# Patient Record
Sex: Male | Born: 1950 | Race: White | Marital: Married | Smoking: Current some day smoker
Health system: Northeastern US, Academic
[De-identification: ages and names within clinical notes are randomized; demographics above are authoritative.]

## PROBLEM LIST (undated history)

## (undated) MED FILL — Immune Globulin (Human) IV Soln 5 GM/50ML: INTRAVENOUS | Qty: 950 | Status: AC

---

## 2018-10-07 DIAGNOSIS — I4891 Unspecified atrial fibrillation: Secondary | ICD-10-CM | POA: Insufficient documentation

## 2021-08-01 ENCOUNTER — Encounter: Payer: Self-pay | Admitting: Gastroenterology

## 2021-08-04 IMAGING — MR MRI CERVICAL SPINE WITHOUT CONTRAST
6 series · 29 of 48 positions shown · IV contrast (gadolinium)
Comparison: None

________________________________________________________________________________________________ 
MRI CERVICAL SPINE WITHOUT CONTRAST, 08/04/2021 [DATE]: 
CLINICAL INDICATION: Quadriparesis, extremity weakness x2 years, gait 
difficulty, balance difficulty
TECHNIQUE: Sagittal T1, Sagittal T2, Sagittal STIR, Axial TSE and Axial [id number] 
images of the cervical spine were performed without intravenous gadolinium 
enhancement.

[Series 101: survey · axial · 10.0mm · 0.94mm/px · z∈[-15,+150]mm · 4 of 9 slices shown]
[im 1/9]
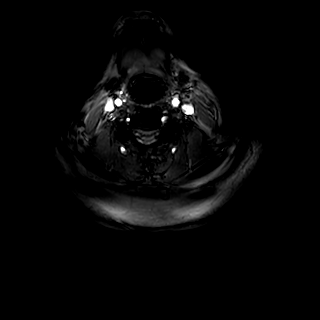
[im 3/9]
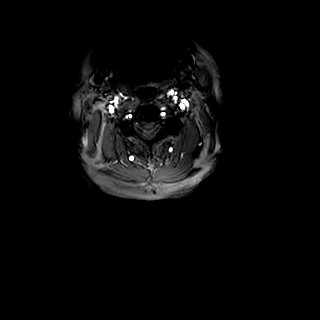
[im 6/9]
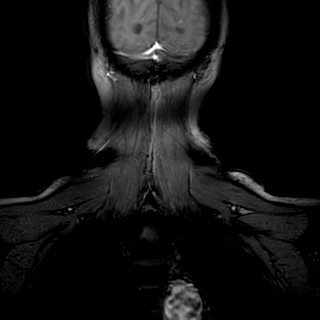
[im 9/9]
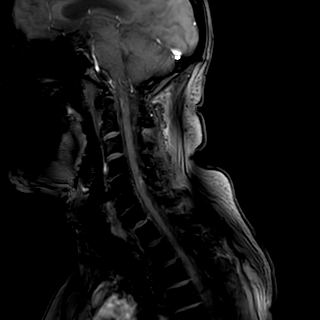

[Series 201: t2w_tse cor · coronal · 5.0mm · 0.52mm/px · 3 of 7 slices shown]
[im 1/7]
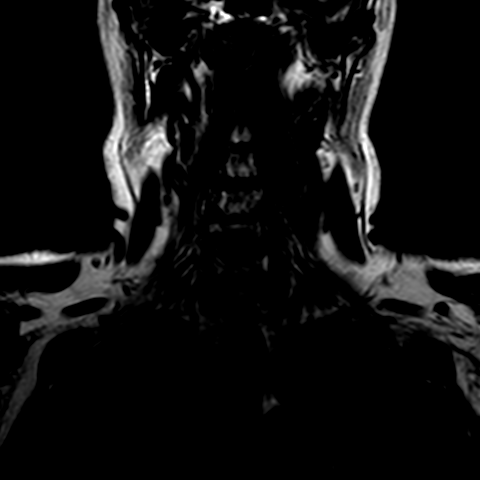
[im 4/7]
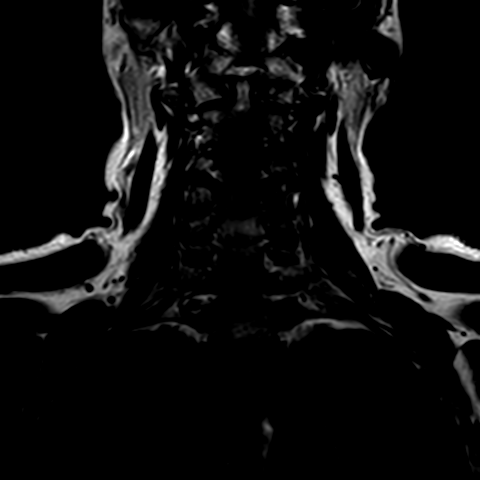
[im 7/7]
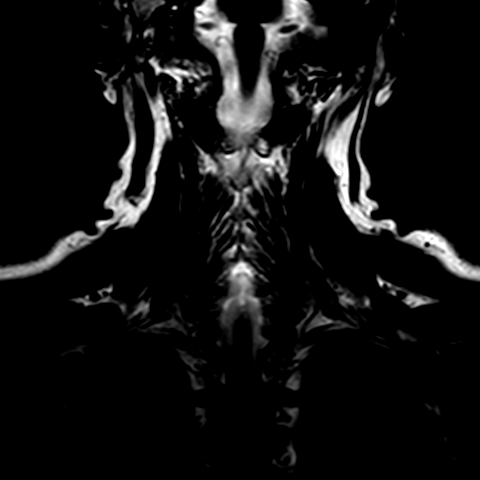

[Series 301: t1w_tse sag · sagittal · 3.0mm · 0.40mm/px · 7 of 15 slices shown]
[im 1/15]
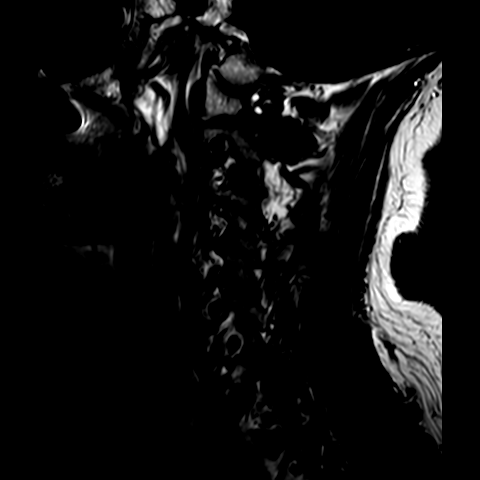
[im 3/15]
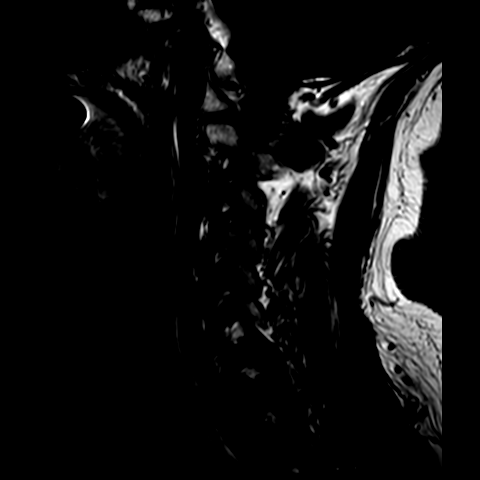
[im 5/15]
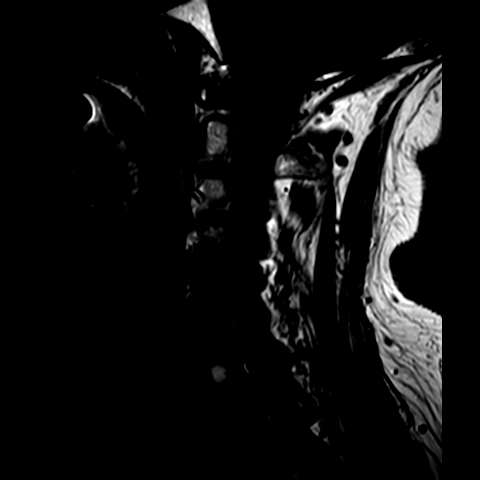
[im 8/15]
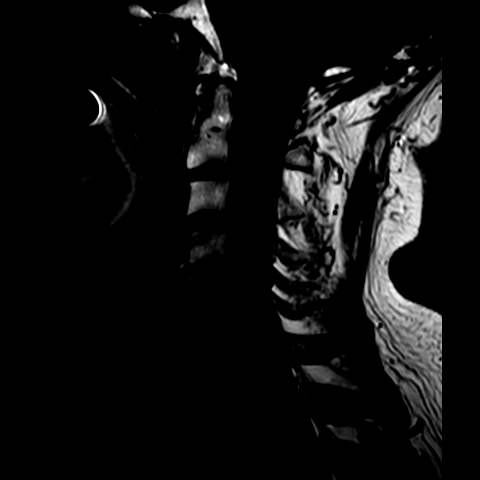
[im 10/15]
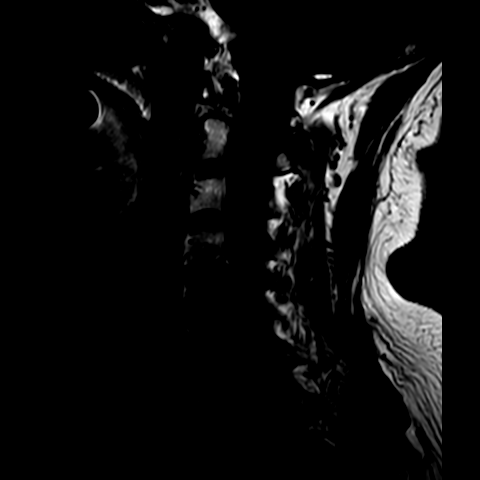
[im 12/15]
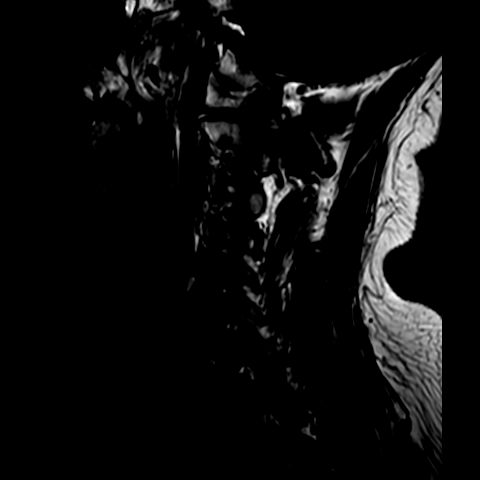
[im 15/15]
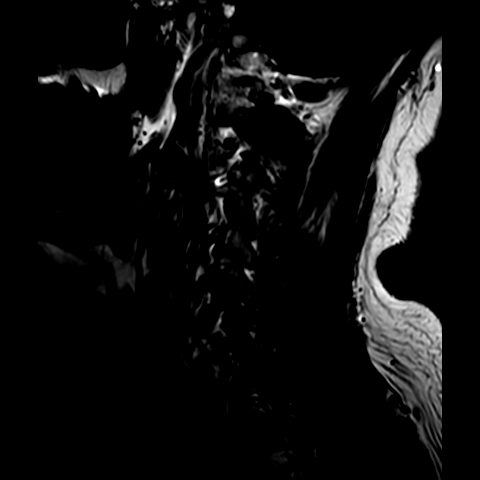

[Series 401: t2w_tse sag · sagittal · 3.0mm · 0.47mm/px · 7 of 15 slices shown]
[im 1/15]
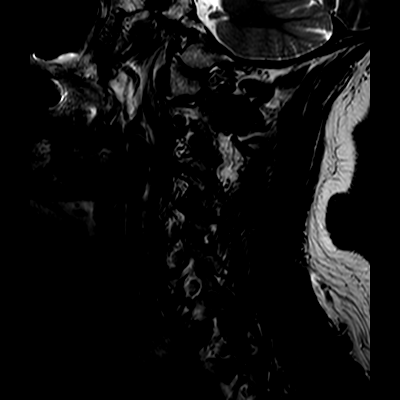
[im 3/15]
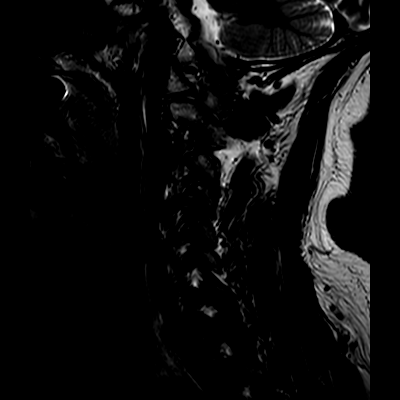
[im 5/15]
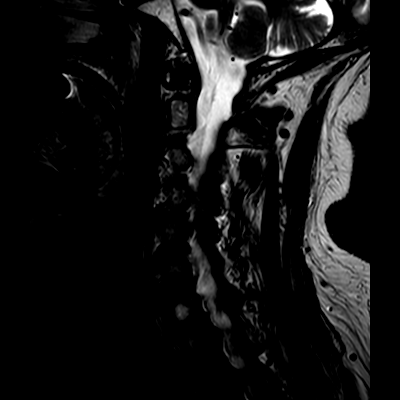
[im 8/15]
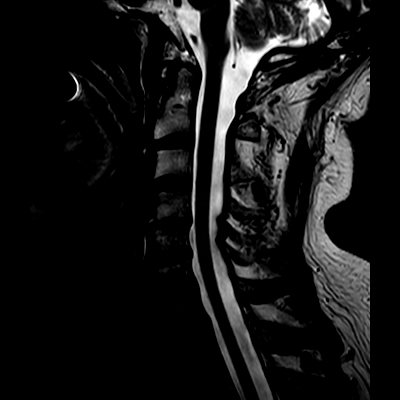
[im 10/15]
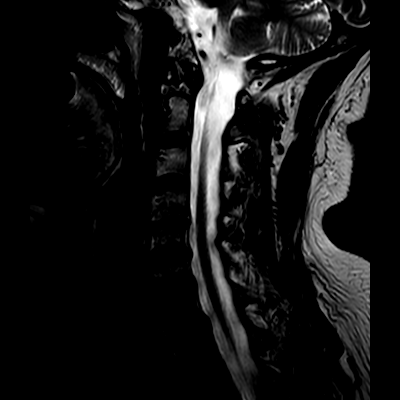
[im 12/15]
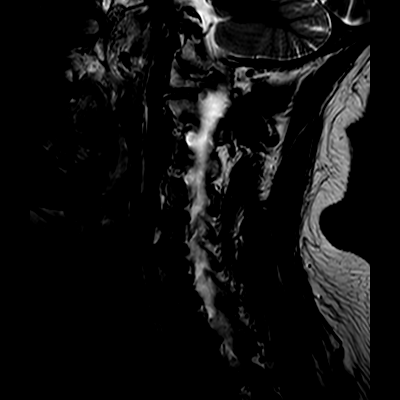
[im 15/15]
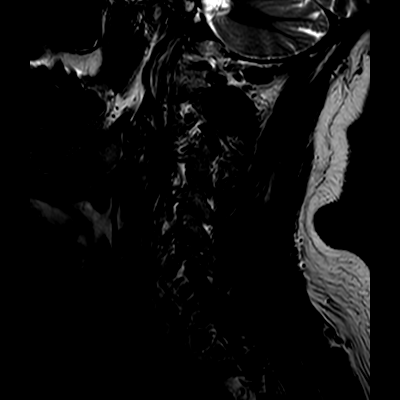

[Series 501: stir_longte sag · sagittal · 3.0mm · 0.56mm/px · 7 of 15 slices shown]
[im 1/15]
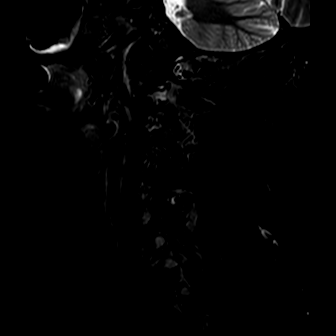
[im 3/15]
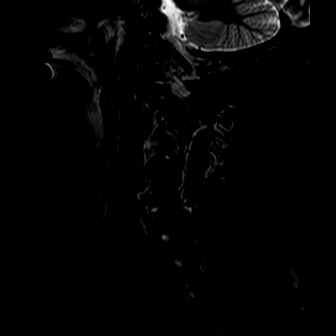
[im 5/15]
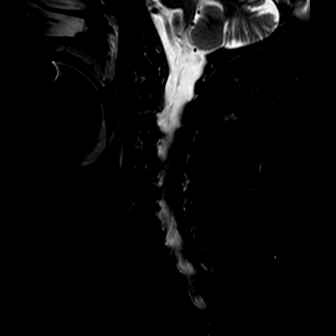
[im 8/15]
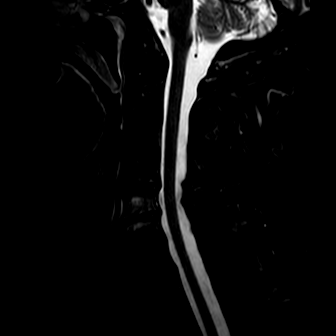
[im 10/15]
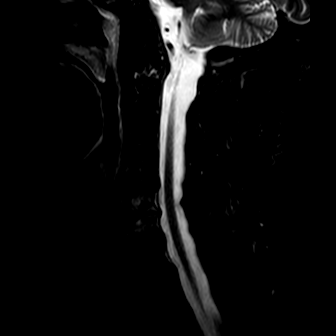
[im 12/15]
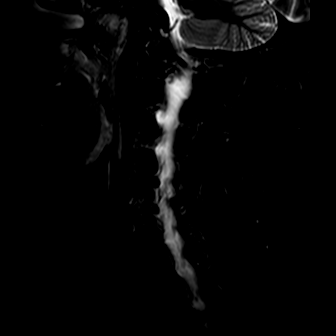
[im 15/15]
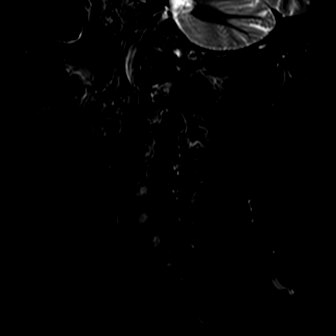

[Series 601: t2w_tse_ax · axial · 3.0mm · 0.36mm/px · 1 of 44 slices shown]
[im 3/44]
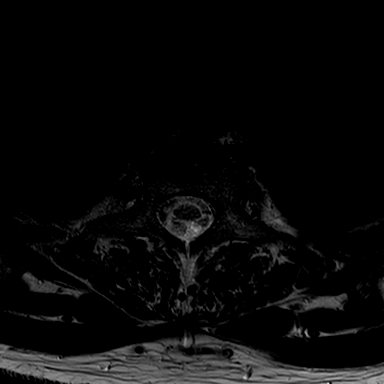

[29 of 48 positions shown; findings below may reference images not displayed]

FINDINGS: Cervical vertebral heights are intact. There is spondylosis, with 
marked disc narrowing from C4 through C7. 
The dens is intact. The craniocervical junction is open. Cord signal appears 
normal. There is no evidence for spinal malignancy. 
There is mild disc-osteophyte at C4-5, C5-6 and C6-7. There is no cord deformity 
or significant cervical canal stenosis. There is 2 mm anterolisthesis at C3-4 
and C7-T1. 
Axial images show mild to moderate bilateral foraminal stenosis at C4-5, also 
bilateral involvement at C5-6. Other cervical foramina appear open. 
There is moderately extensive reactive endplate edema/Modic type I change at 
C5-6, with minimal involvement at C4-5. 
There are zygapophyseal facet degenerative changes throughout the cervical 
spine. There is mild reactive edema involving the left C2-3 zygapophyseal facet.
IMPRESSION: Spondylosis. There is no significant canal stenosis. Mild to moderate bilateral 
foraminal stenosis at C4-5 and C5-6. 
Moderately extensive Modic type I change at C5-6. Mild reactive edema involves 
the left C2-3 zygapophyseal facet pillar. 
Slight degrees of degenerative anterolisthesis at C3-4 and C7-T1. 
Cord signal appears normal. 
No evidence for fracture or spinal malignancy.

## 2021-08-08 IMAGING — MR MRI LUMBAR SPINE WITHOUT CONTRAST
4 of 6 series · 14 of 48 positions shown · IV contrast (gadolinium)
Comparison: None

________________________________________________________________________________________________ 
MRI LUMBAR SPINE WITHOUT CONTRAST, 08/08/2021 [DATE]: 
CLINICAL INDICATION: Quadriparesis. Patient declined to provide history for 
lumbar spine.
TECHNIQUE: Sagittal T1, Sagittal T2, Sagittal STIR, Axial T1 and Axial T2 MR 
images of the lumbar spine were performed without intravenous gadolinium 
enhancement.

[Series 101: survey · axial · 10.0mm · 1.39mm/px · z∈[-15,+199]mm · 3 of 9 slices shown]
[im 1/9]
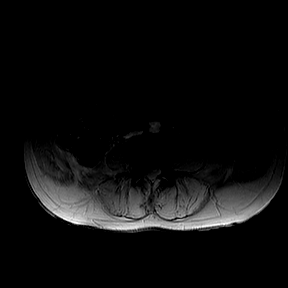
[im 5/9]
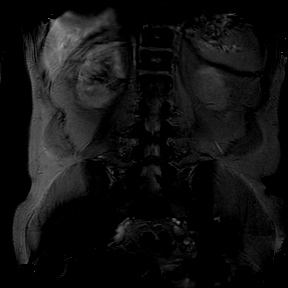
[im 9/9]
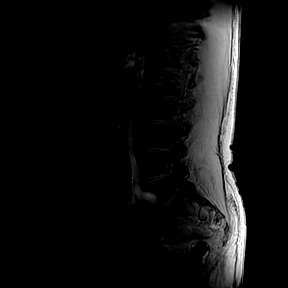

[Series 201: t2w_cor-surv · coronal · 6.0mm · 0.50mm/px · 3 of 6 slices shown]
[im 1/6]
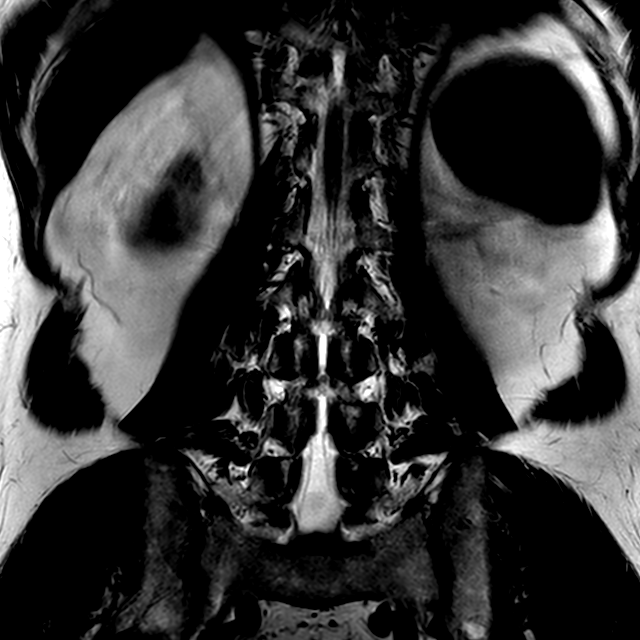
[im 3/6]
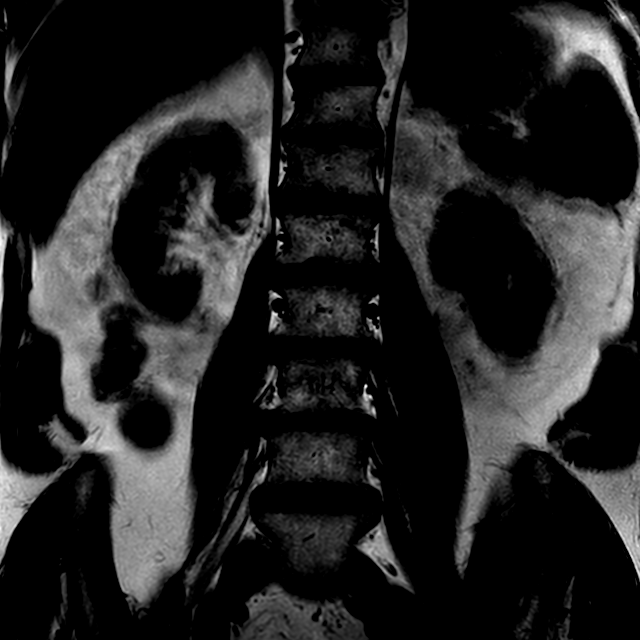
[im 6/6]
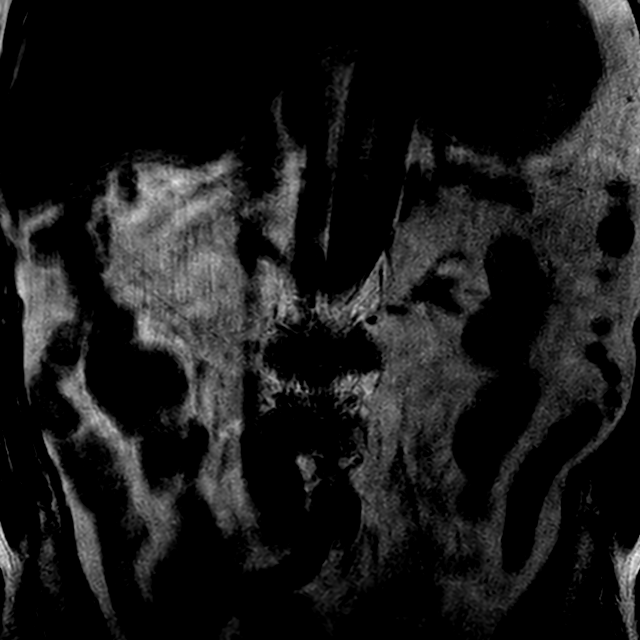

[Series 301: t2w_tse sag · sagittal · 4.0mm · 0.35mm/px · 5 of 18 slices shown]
[im 1/18]
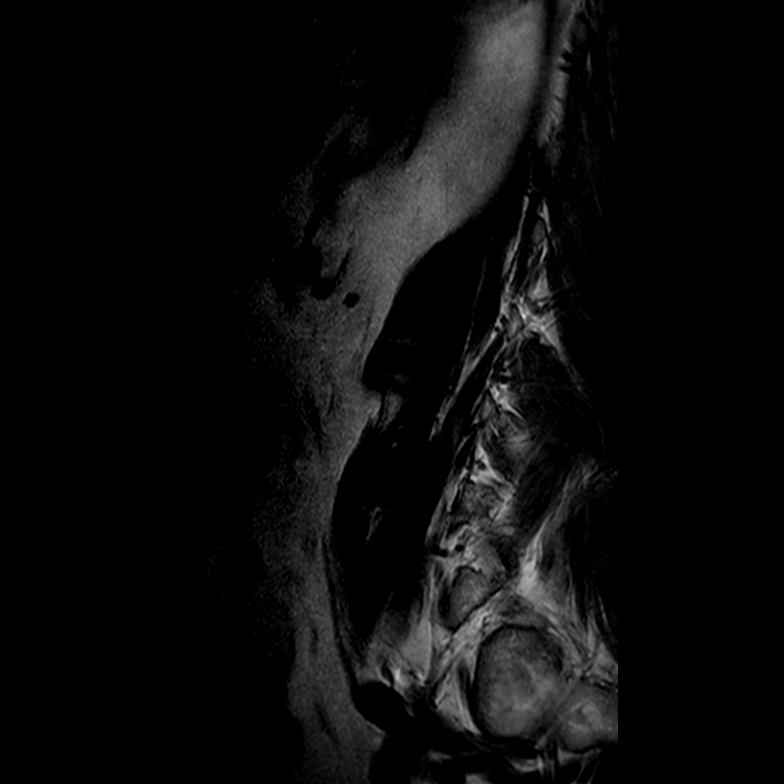
[im 3/18]
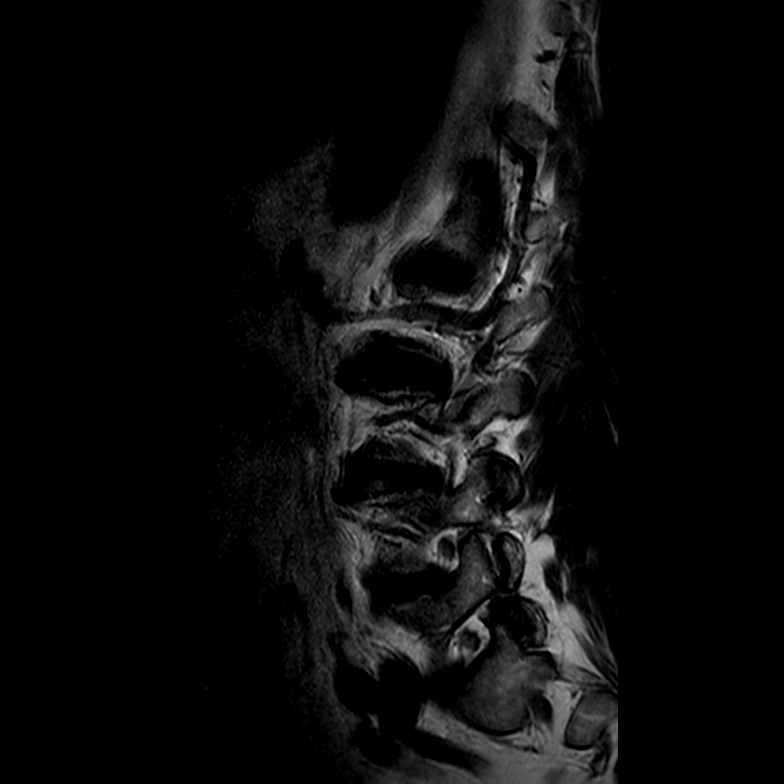
[im 5/18]
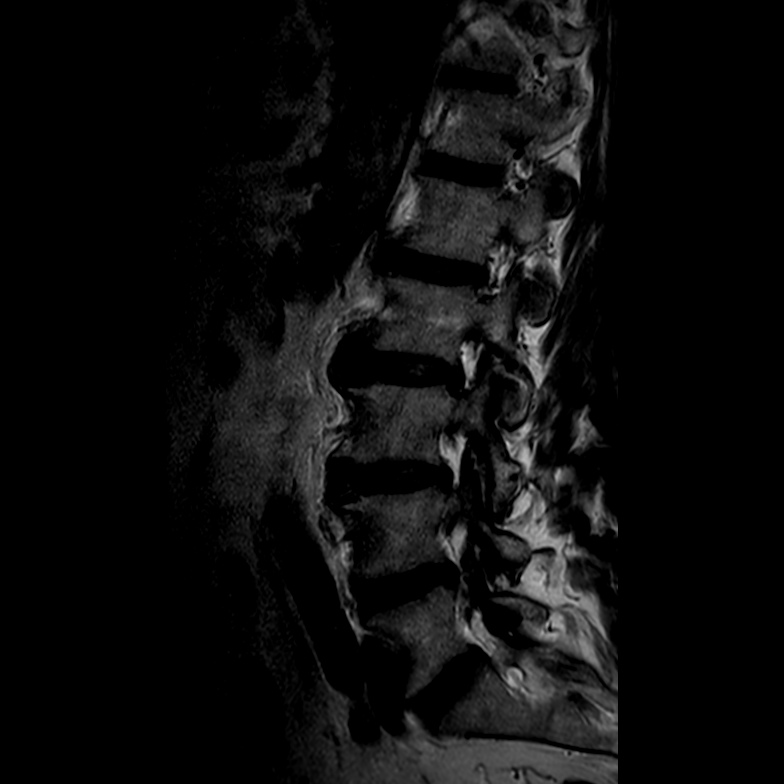
[im 9/18]
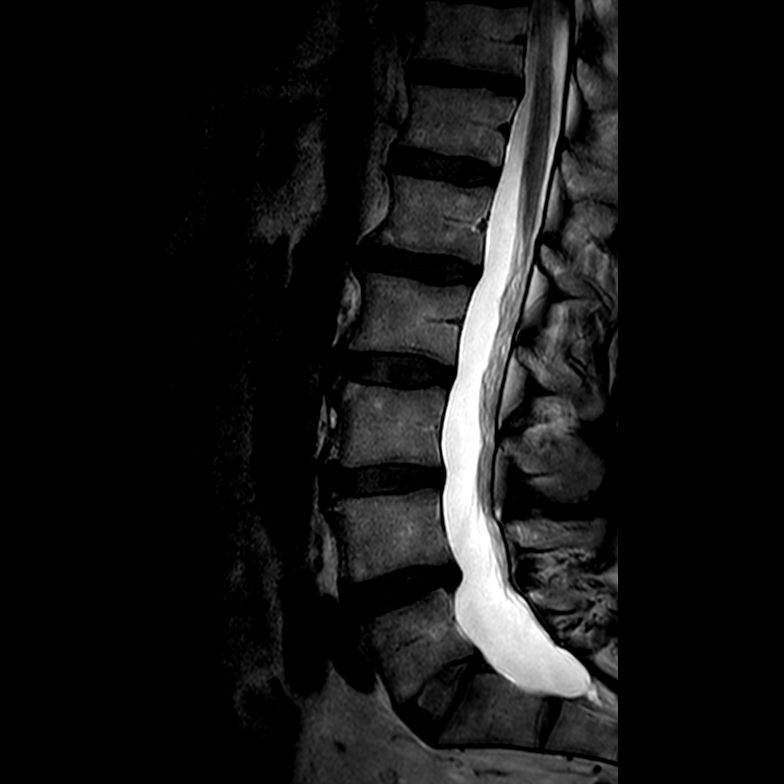
[im 15/18]
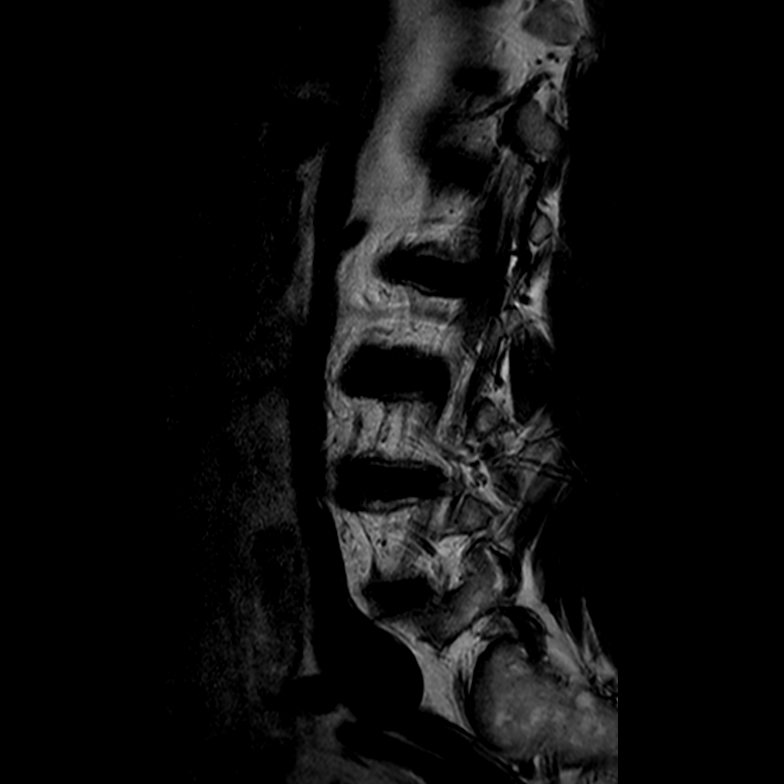

[Series 401: t1w_tse sag · sagittal · 4.0mm · 0.54mm/px · 3 of 18 slices shown]
[im 3/18]
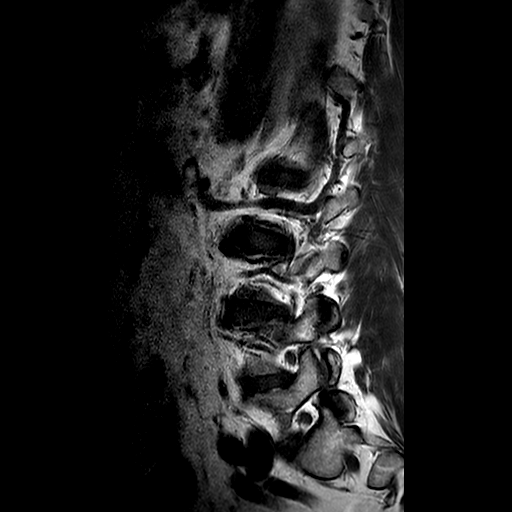
[im 9/18]
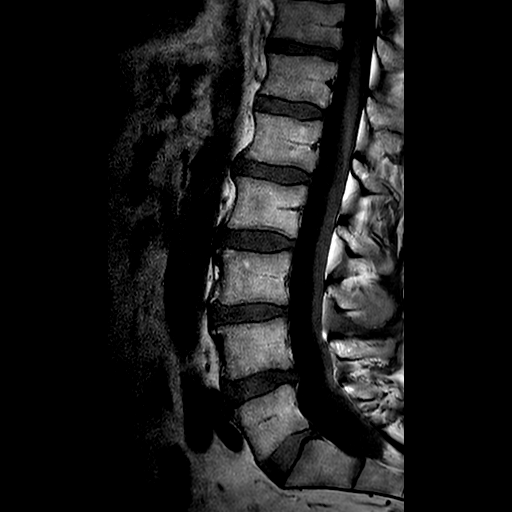
[im 15/18]
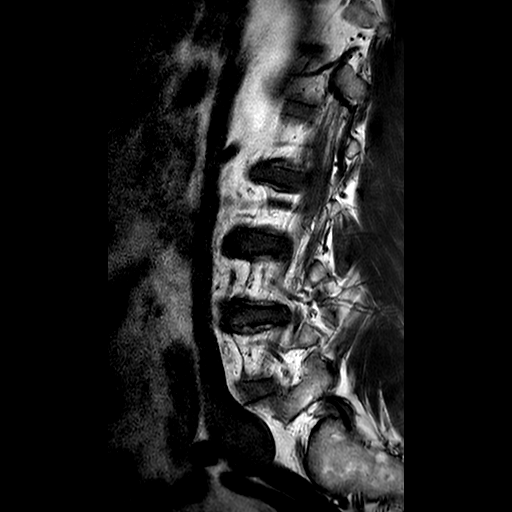

[14 of 48 positions shown; findings below may reference images not displayed]

FINDINGS: Lumbar vertebral heights are intact. 2 mm retrolisthesis at L4-5. 
There is moderate narrowing on the posterior L5-S1 disc interspace. Other lumbar 
disc heights are intact. 
There is midline disc bulge at L4-5, without significant canal stenosis. Mild 
narrowing of the upper L5 lateral recesses. There is mild disc bulge at L3-4 and 
borderline canal stenosis. Canal is open elsewhere. There is no significant 
foraminal impingement. Conus terminates opposite T12-L1. No Modic type I 
changes. No evidence for compression fracture or malignancy. 
There is asymmetric decreased size of the right psoas muscle compared to the 
left. On the lowest axial images there is asymmetry of the upper visualized 
iliopsoas musculature, likely with atrophy of the right. CT of the pelvis would 
be useful for further evaluation.
IMPRESSION: Mild disc bulge at L4-5 with slight retrolisthesis produces mild encroachment on 
the upper L5 lateral recesses. This could worsen with weightbearing; clinical 
correlation. 
Conus appears normal in signal, position and morphology. 
Note made of asymmetry of the right psoas muscle and visualized right iliac 
venous, decreased in size compared to the left. There is no lumbar scoliosis. 
Clinical correlation advised. CT of the pelvis would be useful for further 
evaluation.

## 2021-08-10 IMAGING — MR MRI THORACIC SPINE WITHOUT CONTRAST
5 of 10 series · 15 of 48 positions shown · non-contrast
Comparison: None

________________________________________________________________________________________________ 
MRI THORACIC SPINE WITHOUT CONTRAST, 08/10/2021 [DATE]: 
CLINICAL INDICATION: Quadriparesis, arm and leg weakness bilaterally x1 year
TECHNIQUE: Sagittal T1, Sagittal T2, Sagittal STIR, Axial T2 and Axial T1 MR 
images of the thoracic were performed without intravenous contrast enhancement.

[Series 101: survey · axial · 10.0mm · 1.39mm/px · z∈[-15,+199]mm · 2 of 9 slices shown]
[im 1/9]
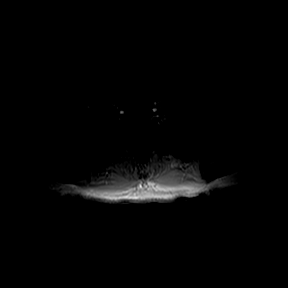
[im 9/9]
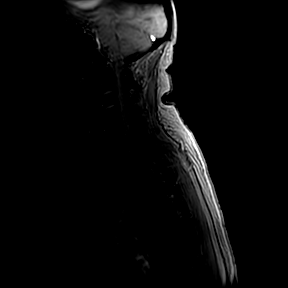

[Series 201: t2w_tse cor · coronal · 6.0mm · 0.52mm/px · 1 of 7 slices shown]
[im 1/7]
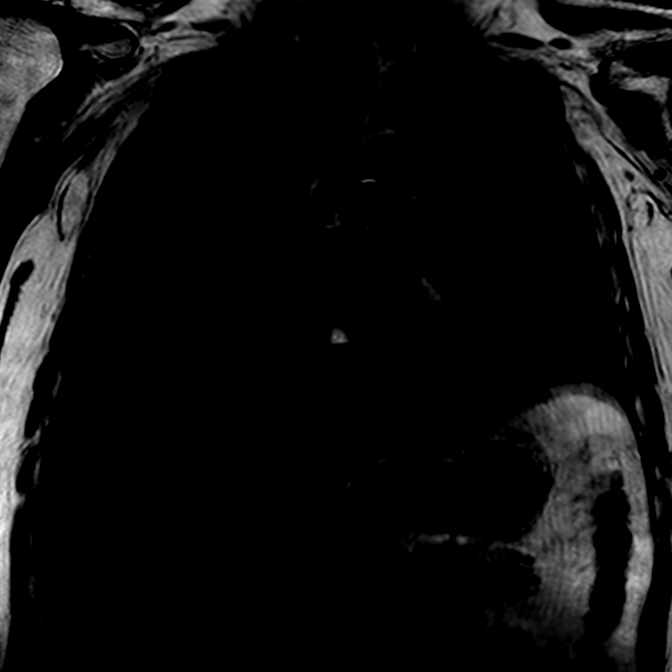

[Series 301: T1 · sagittal · 5.5mm · 0.66mm/px · 4 of 15 slices shown (1 of 3)]
[im 1/15]
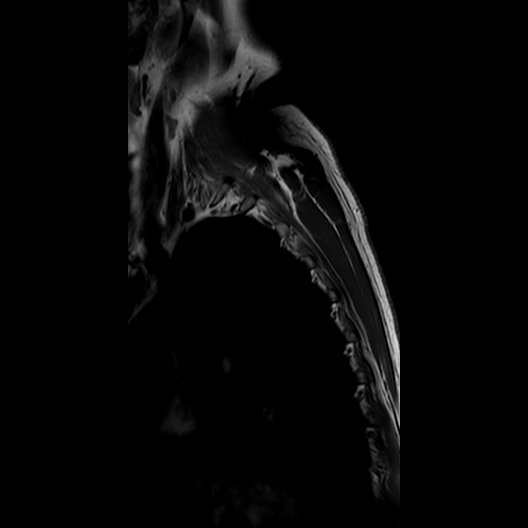
[im 5/15]
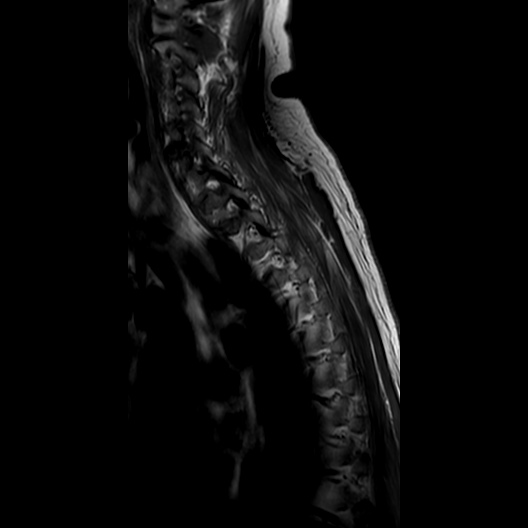
[im 10/15]
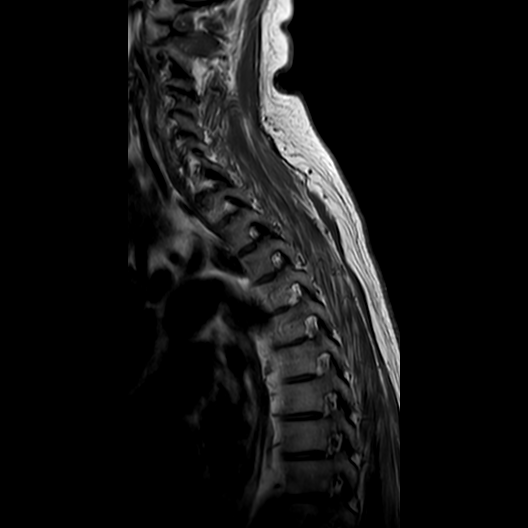
[im 15/15]
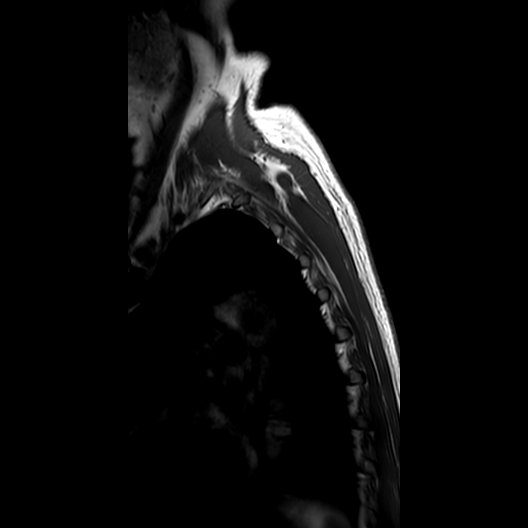

[Series 302: T1 · sagittal · 5.5mm · 0.66mm/px · 4 of 15 slices shown (2 of 3)]
[im 1/15]
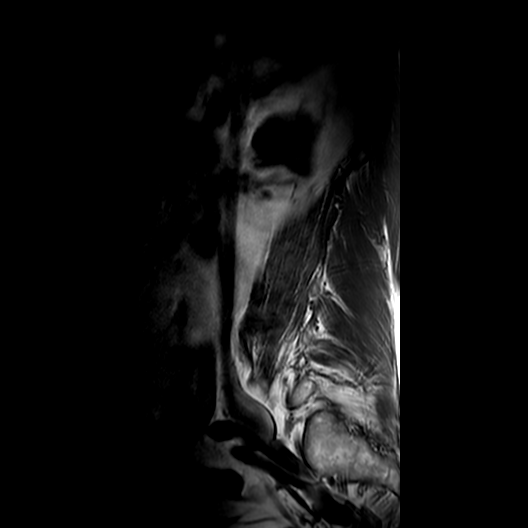
[im 5/15]
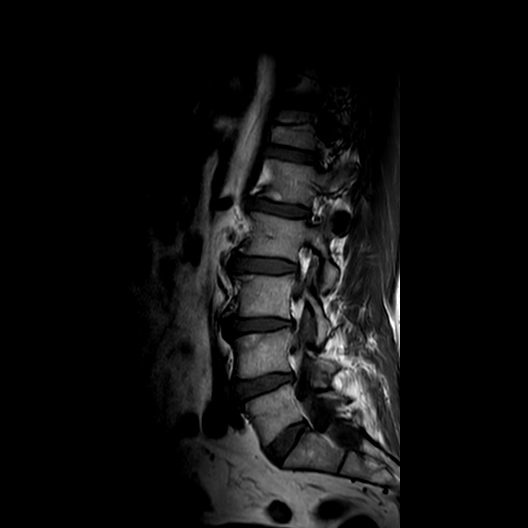
[im 10/15]
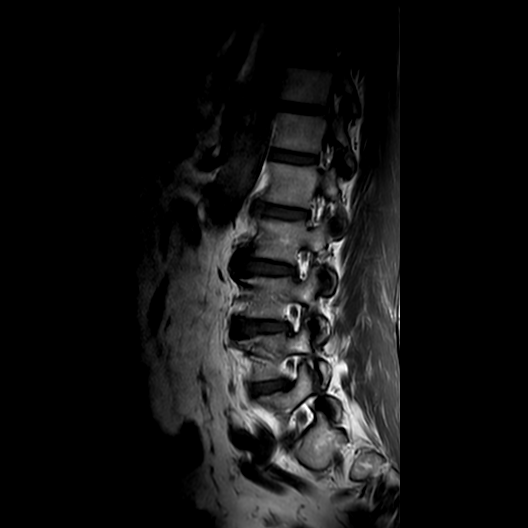
[im 15/15]
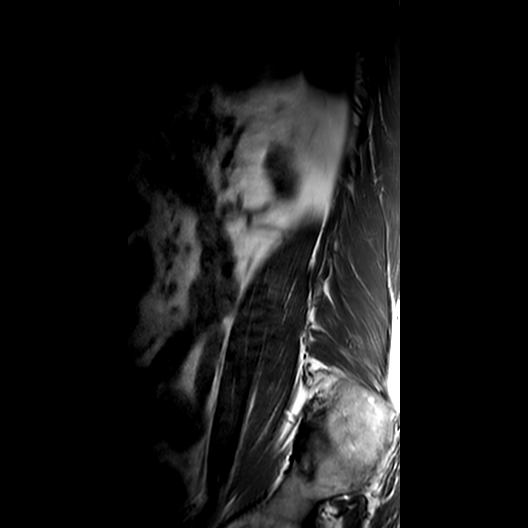

[Series 303: T1 · sagittal · 5.5mm · 0.66mm/px · 4 of 15 slices shown (3 of 3)]
[im 1/15]
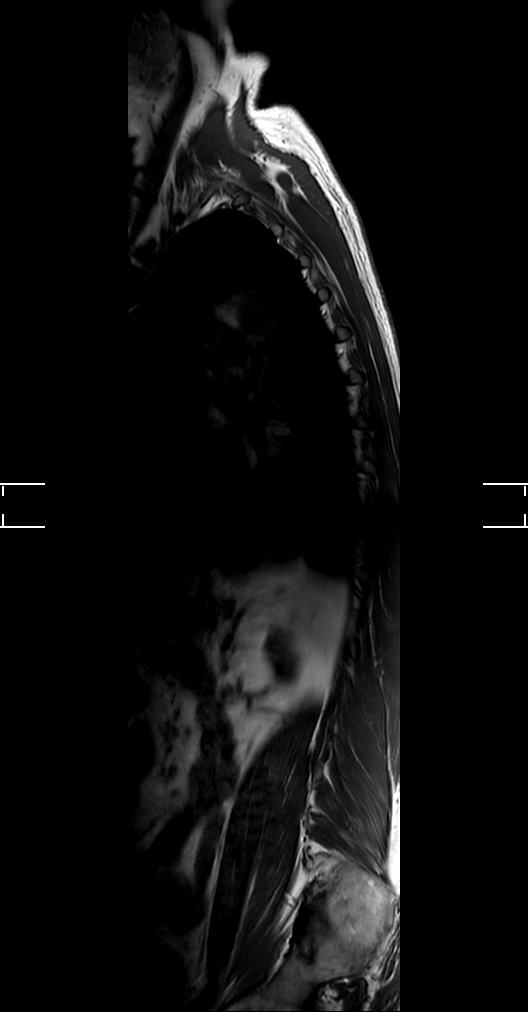
[im 5/15]
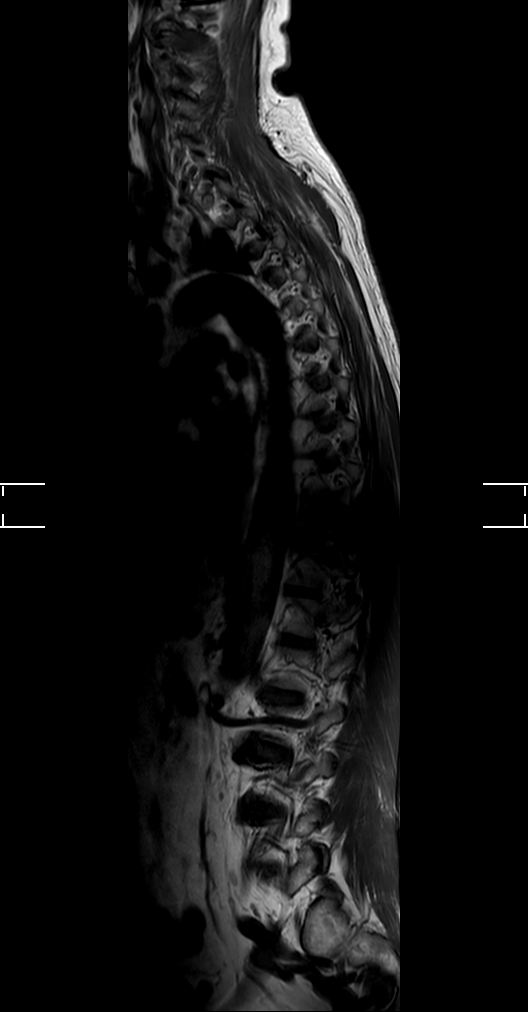
[im 10/15]
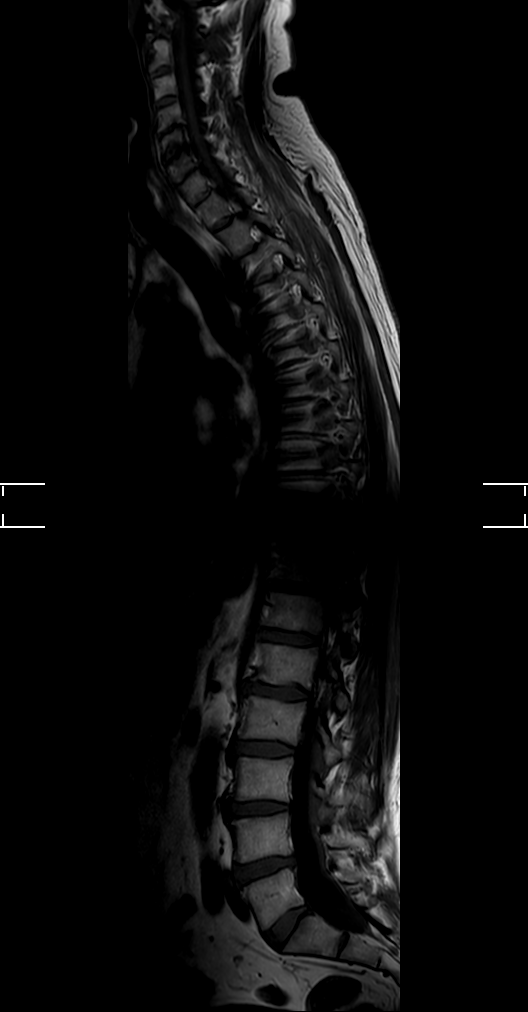
[im 15/15]
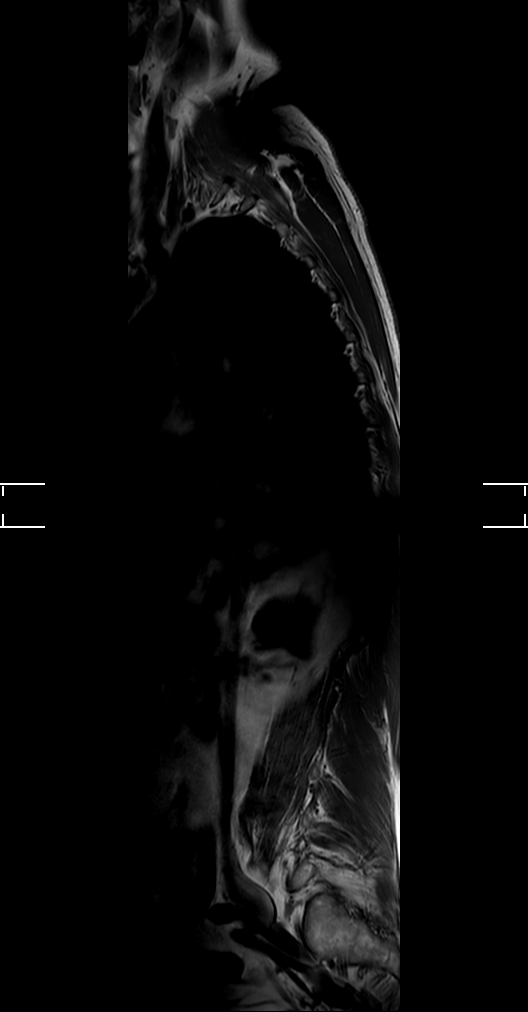

[15 of 48 positions shown; findings below may reference images not displayed]

FINDINGS: There is mild patient motion artifact limiting detail. 
Thoracic vertebral heights are intact. Moderate multilevel degenerative changes. 
Disc narrowing is most pronounced at T5-6. There is mild Modic type I change 
along the anterior T5-6 interspace. 
There is mild disc bulge at T5-6 approximating the cord without deformity. There 
is no canal stenosis at this or other thoracic level. Note made of CSF dephasing 
artifact in the dorsal thecal sac throughout the thoracic spine. Cord signal 
appears normal. There is no evidence for spinal malignancy. No paraspinal mass. 
No layering pleural fluid. There are benign-appearing right renal cysts.
IMPRESSION: Thoracic degenerative changes. No significant thoracic canal or foraminal 
stenosis. 
Mild Modic type I change at T5-6 . 
No evidence for fracture or spinal malignancy. Cord signal appears normal, 
allowing for mild patient motion artifact.

## 2021-09-30 ENCOUNTER — Encounter: Payer: Self-pay | Admitting: Gastroenterology

## 2021-10-17 DIAGNOSIS — R29898 Other symptoms and signs involving the musculoskeletal system: Secondary | ICD-10-CM

## 2021-10-17 DIAGNOSIS — G7000 Myasthenia gravis without (acute) exacerbation: Secondary | ICD-10-CM

## 2022-03-03 NOTE — Progress Notes (Signed)
Neuromuscular New Patient Visit Note    Dear Dr. Harriet Masson,    We had the pleasure of seeing your patient, Jonathon Lawrence, in the Neuromuscular clinic today.  He is a 71 y.o. male being seen for evaluation of possible myasthenia gravis. He is accompanied by his wife.    He notes he had the COVID vaccine in January 2021, then February 2021, and then December 2021. After this, he slowly noticed his walking was slower and his balance was worsening. He also notes he had several months of lightheadedness but this resolved. His legs started feeling heavy in 2022. He noticed he used to walk 5-8 miles per day but now he can only walk 4 miles. He used to walk 18 holes of golf without difficulty. Now he struggles making it through 18 holes. He feels throbbing in his calves when he walks. He has discomfort in the balls of his feet. His feels his balance is worsening. He has had stumbles but not falls. He is not stubbing his toes. He has to hold onto the railing going up stairs due to a feeling of imbalance. However, he is able to stand up from a chair or toilet without difficulty. He denies numbness or tingling in his arms or legs. He denies weakness in his arms or hands. He denies neck pain, back pain, incontinence, saddle anesthesia, or difficulty getting erections.    He was seen by Dr. Harriet Masson (neurology) in [place] who felt he may have myasthenia gravis. He tried 60 mg pyridostigmine with initially some benefit. Now, he says it is not helping. He denies difficulty talking, chewing, swallowing, breathing, brushing his teeth, rising from a chair, diplopia, or eyelid droop. He did not take pyridostigmine today.    Myasthenia Gravis Scale:    Talking: 0 - Normal  Chewing: 0 - Normal  Swallowing: 0 - Normal  Breathing: 0 - Normal  Impairment of ability to brush teeth or comb hair: 0 - None  Impairment of ability to arise from a chair: 0 - None  Double Vision: 0 - None  Eyelid droop: 0 - None  Total: 0     Labs Reviewed:      Negative AChR abs 08/13/21    CK 398    Imaging Reviewed:     MRI L spine w/o contrast 08/08/21 by report: mild disc bulge L4-5    MRI T spine w/o contrast 08/10/21 by report: cord normal; thoracic degenerative changes    Normal EMG by report but review of data shows absent sensory nerves in the legs and reduced amplitudes and slowing of conduction velocities in the motor nerves of the legs.    The Patient's problem list, allergies, medications, past medical history, past surgical history, past family history, and social history were reviewed and updated as appropriate.  Relevant findings have been noted in the HPI.  Please see the EHR for full details.    Past Medical History: No past medical history on file.s/p ablation for Afib, s/p ablation Aflutter, myopic degeneration right eye, bilateral cataracts, macular hole, kidney stone, hypothyroidism, HLD  Past Surgical History: No past surgical history on file. bilateral hip replacements  Family History: family history is not on file. His father died from pancreatic cancer. His mother died from a pulmonary embolism and multiple myeloma. His brother has venous insufficiency. His children are healthy.  Social History:  reports that he has been smoking cigars. He has never used smokeless tobacco. Ocasional alcohol. No recreational drug use. He is  retired from [employer].   Allergies:   Allergies as of 03/04/2022   . (No Known Allergies (drug, envir, food or latex))      Medications:   Current Outpatient Medications   Medication Sig Dispense Refill   . pyridostigmine (MESTINON) 60 mg tablet 1 tablet     . latanoprostene bunod (VYZULTA) 0.024 % ophthalmic solution 1 drop into affected eye in the evening     . atorvastatin (LIPITOR) 10 mg tablet 1 tablet     . levothyroxine (SYNTHROID, LEVOTHROID) 75 mcg tablet 1 tablet in the morning on an empty stomach     . Multiple Vitamins-Minerals (PRESERVATION AREDS) TABS tablet as directed       No current  facility-administered medications for this visit.        Physical Examination:   BP 149/77   Pulse 52   Temp 36.7 C (98 F)   Wt 92.1 kg (203 lb)   SpO2 98%     General: Pleasant and sitting comfortably  CV:  RRR  Pulm: Normal work of breathing  Extremities: high arches and hammertoes    MS: Alert and interactive. Language appears intact.  CN: VF full to finger counting. Pupils are 4/4 to 2/2. Ocular versions full and conjugate. Pursuits are smooth. Facial sensation intact to light touch.  Able to raise eye brows symmetrically. Able to bury lashes on eye closure with symmetrical resistance to opening. No ptosis or fatigable ptosis. Able to puff up cheeks. Smile symmetrical with equal activation. Hearing grossly intact. Palate elevates symmetrically. Shoulder shrug strong bilaterally. Tongue strong with lateral protrusion.  No fasciculations in tongue. No atrophy of the tongue. No dysarthria.    Motor:  Tone: normal  Muscle bulk: normal  Involuntary movements: none  Muscle Right Left   Neck flexor 5    Upper extremity     Shoulder abductor 5 5   Shoulder forward flexor 5 5   Shoulder external rotator 5 5   Shoulder internal rotator 5 5   Elbow flexors 5 5   Elbow extensors 5 5   Wrist flexors 5 5   Wrist extensors 5 5   Distal finger flexors  5 5   Finger extensors 5 5   Finger abductors 4+ 4+   Thumb flexor (FPL) 5 5   Thumb abductor 5 5   Lower extremity     Hip flexor 5 5   Hip adductor 5 2   Hip abductor 4 5   Knee flexor 5 5   Knee extensor 5 5   Ankle dorsiflexor 4+ 4+   Ankle plantarflexor 4+ 4+   Ankle inversion 5 5   Ankle eversion 5 5   Toe extensor 4 5   Toe flexor 4 4     He can walk on heels but not toes. Can rise from seated position with arms crossed.      Reflex Right Left   BR 3+ 3+   Biceps 3+ 3+   Triceps 3+ 3+   Patellae 2+ 2+   Ankles 0 0     B/L Toes: going downwards  B/L Hoffman's sign: negative    Sensory:  Pin Prick: cannot tell difference between sharp/dull in arms/legs with eyes  closed. Reduced pin left toes.   Light touch: Intact and symmetric in upper and lower extremities.  Vibration (R/L, in seconds)   Great toe: 0/0   Medial malleolus: 6/6  Position Sense: Intact at the great toes bilaterally  Romberg's sign: positive    Coordination:  Finger-Nose-Finger: Intact    Gait: antalgic gait. Unable to tandem gait.      Impression:     1) sensorimotor polyneuropathy    Comment: Jonathon Lawrence is a 71 y.o. male with hypothyroidism, HLD, bilateral hip replacements, history of ablation for Afib and Aflutter presenting with slowly progressive lower extremity weakness, gait instability, and imbalance since 2021. His neuro exam is notable for distal leg weakness, reduced pinprick in the left toes, absent vibration at the toes, absent ankle jerks, positive Romberg, and difficulty with tandem gait. His nerve conduction study was consistent with a sensorimotor polyneuropathy with widespread signs of reinnervation on EMG (report to be uploaded). His presentation is most consistent with a sensorimotor polyneuropathy although a polyradiculopathy is possible. We think this electrodiagnostic testing makes sense with his presentation and would explain his gait instability and imbalance. His symptoms are not consistent with a neuromuscular junction disorder such as myasthenia gravis given the lack of bulbar symptoms in particular, and he also had negative AChR antibodies. He does not perceive benefit from the pyridostigmine trial so we recommend discontinuing this medication at this time.     The cause of his neuropathy is unclear. We recommend further lab testing as below. Although there is no family history of neuropathy, given his continued progression, we will check with our genetic counselor to see if he would qualify for genetic testing. If lab testing is negative, we may consider repeating an MRI of the spine and consider an LP to look for potential polyradicular causes of his weakness. He was  evaluated by PT today who recommend either outpatient PT for gait and balance training or foot-up ankle braces.     Recommendations:     - discontinue pyridostigmine  - labs: CMP, CBC, ANA, ESR, CRP, kappa/lambda free light chains, copper, zinc, folate, Vit B12, MMA, Vit B1, Vit B6, HbA1C, TSH, T4  - will reach out to genetic counselor for genetic testing for neuropathy panel  - outpatient PT or foot up braces  - follow up in 9 months    Thank you for allowing [place] to participate in the care of your patient.  Please call us with any questions.    Cynda Familia, MD  Neuromuscular Medicine  Department of Neurology    Patient seen with Dr. Junius Roads, neuromuscular attending

## 2022-03-04 ENCOUNTER — Ambulatory Visit: Admit: 2022-03-04 | Discharge: 2022-03-04 | Disposition: A | Discharge status: Home / Self Care | Payer: Medicare Other

## 2022-03-04 ENCOUNTER — Other Ambulatory Visit: Payer: Self-pay | Admitting: Neurology

## 2022-03-04 ENCOUNTER — Encounter: Payer: Self-pay | Admitting: Oncology

## 2022-03-04 ENCOUNTER — Ambulatory Visit: Payer: Medicare Other

## 2022-03-04 ENCOUNTER — Other Ambulatory Visit: Payer: Self-pay

## 2022-03-04 ENCOUNTER — Encounter: Payer: Self-pay | Admitting: Gastroenterology

## 2022-03-04 ENCOUNTER — Ambulatory Visit: Payer: Medicare Other | Attending: Neurology | Admitting: Oncology

## 2022-03-04 ENCOUNTER — Other Ambulatory Visit
Admission: RE | Admit: 2022-03-04 | Discharge: 2022-03-04 | Disposition: A | Discharge status: Home / Self Care | Payer: Medicare Other | Source: Ambulatory Visit

## 2022-03-04 VITALS — BP 149/77 | HR 52 | Temp 98.0°F | Wt 203.0 lb

## 2022-03-04 DIAGNOSIS — E039 Hypothyroidism, unspecified: Secondary | ICD-10-CM

## 2022-03-04 DIAGNOSIS — H4421 Degenerative myopia, right eye: Secondary | ICD-10-CM | POA: Insufficient documentation

## 2022-03-04 DIAGNOSIS — G629 Polyneuropathy, unspecified: Secondary | ICD-10-CM

## 2022-03-04 DIAGNOSIS — R29898 Other symptoms and signs involving the musculoskeletal system: Secondary | ICD-10-CM | POA: Insufficient documentation

## 2022-03-04 DIAGNOSIS — N200 Calculus of kidney: Secondary | ICD-10-CM | POA: Insufficient documentation

## 2022-03-04 DIAGNOSIS — Z96641 Presence of right artificial hip joint: Secondary | ICD-10-CM | POA: Insufficient documentation

## 2022-03-04 DIAGNOSIS — E785 Hyperlipidemia, unspecified: Secondary | ICD-10-CM

## 2022-03-04 DIAGNOSIS — Z8679 Personal history of other diseases of the circulatory system: Secondary | ICD-10-CM | POA: Insufficient documentation

## 2022-03-04 DIAGNOSIS — Z9889 Other specified postprocedural states: Secondary | ICD-10-CM | POA: Insufficient documentation

## 2022-03-04 DIAGNOSIS — G609 Hereditary and idiopathic neuropathy, unspecified: Secondary | ICD-10-CM | POA: Insufficient documentation

## 2022-03-04 DIAGNOSIS — G7000 Myasthenia gravis without (acute) exacerbation: Secondary | ICD-10-CM | POA: Insufficient documentation

## 2022-03-04 DIAGNOSIS — H35059 Retinal neovascularization, unspecified, unspecified eye: Secondary | ICD-10-CM | POA: Insufficient documentation

## 2022-03-04 DIAGNOSIS — H269 Unspecified cataract: Secondary | ICD-10-CM | POA: Insufficient documentation

## 2022-03-04 DIAGNOSIS — H35349 Macular cyst, hole, or pseudohole, unspecified eye: Secondary | ICD-10-CM | POA: Insufficient documentation

## 2022-03-04 DIAGNOSIS — Z96642 Presence of left artificial hip joint: Secondary | ICD-10-CM | POA: Insufficient documentation

## 2022-03-04 LAB — CRP: CRP: <3 mg/L (ref 0–8)

## 2022-03-04 LAB — CBC AND DIFFERENTIAL
Baso # K/uL: 0.1 10*3/uL (ref 0.0–0.1)
Basophil %: 0.7 %
Eos # K/uL: 0.3 10*3/uL (ref 0.0–0.5)
Eosinophil %: 4.9 %
Hematocrit: 48 % (ref 40–51)
Hemoglobin: 15.7 g/dL (ref 13.7–17.5)
IMM Granulocytes #: 0 10*3/uL (ref 0.0–0.0)
IMM Granulocytes: 0.1 %
Lymph # K/uL: 1.4 10*3/uL (ref 1.3–3.6)
Lymphocyte %: 20 %
MCH: 30 pg (ref 26–32)
MCHC: 33 g/dL (ref 32–37)
MCV: 91 fL (ref 79–92)
Mono # K/uL: 0.8 10*3/uL (ref 0.3–0.8)
Monocyte %: 11.4 %
Neut # K/uL: 4.3 10*3/uL (ref 1.8–5.4)
Nucl RBC # K/uL: 0 10*3/uL (ref 0.0–0.0)
Nucl RBC %: 0 /100 WBC (ref 0.0–0.2)
Platelets: 278 10*3/uL (ref 150–330)
RBC: 5.2 MIL/uL (ref 4.6–6.1)
RDW: 13.1 % (ref 11.6–14.4)
Seg Neut %: 62.9 %
WBC: 6.9 10*3/uL (ref 4.2–9.1)

## 2022-03-04 LAB — COMPREHENSIVE METABOLIC PANEL
ALT: 35 U/L (ref 0–50)
AST: 37 U/L (ref 0–50)
Albumin: 4.5 g/dL (ref 3.5–5.2)
Alk Phos: 84 U/L (ref 40–130)
Anion Gap: 10 (ref 7–16)
Bilirubin,Total: 0.8 mg/dL (ref 0.0–1.2)
CO2: 26 mmol/L (ref 20–28)
Calcium: 9.4 mg/dL (ref 8.6–10.2)
Chloride: 103 mmol/L (ref 96–108)
Creatinine: 1.09 mg/dL (ref 0.67–1.17)
Glucose: 110 mg/dL — ABNORMAL HIGH (ref 60–99)
Lab: 17 mg/dL (ref 6–20)
Potassium: 4.1 mmol/L (ref 3.3–5.1)
Sodium: 139 mmol/L (ref 133–145)
eGFR BY CREAT: 72 *

## 2022-03-04 LAB — VITAMIN B12: Vitamin B12: 444 pg/mL (ref 232–1245)

## 2022-03-04 LAB — T4, FREE: Free T4: 1.3 ng/dL (ref 0.9–1.7)

## 2022-03-04 LAB — SEDIMENTATION RATE, AUTOMATED: Sedimentation Rate: 12 mm/hr (ref 0–20)

## 2022-03-04 LAB — TSH: TSH: 3.26 u[IU]/mL (ref 0.27–4.20)

## 2022-03-04 LAB — FOLATE: Folate: >14 ng/mL (ref >=4.6)

## 2022-03-04 NOTE — Procedures (Signed)
Exam location:  [facility] Attending physician: Doylene Canard, M.D.     Patient: Jonathon Lawrence, Jonathon Lawrence  Patient ID: [id number]      Date of Birth: 14-Mar-1951 Height: 6'0" Gender: Male   Report ID: [id number] Exam Date: 03/04/2022     Referring Physician(s):  Dr. Andrey Cota / Dr. Napoleon Form   Provider(s):  Doylene Canard, M.D. / Donald Siva R. NCS. T     Referring Diagnosis:  Peripheral Neuropathy, Unspecified Type / Weakness   Final Diagnosis:  Sensorimotor Polyneuropathy, Axonal     Patient History   Jonathon Lawrence is a 71 year-old man seen in clinic by Dr. Junius Roads and referred for electrodiagnostic evaluation of peripheral neuropathy. For a complete history, please see Dr. Lucinda Dell neuromuscular clinic note from 03/04/2022.      A time out procedure was completed to verify the patient's identity including first and last name, date of birth and confirmed with id band. The correct procedure and site were reviewed with the patient.  Verbal consent was obtained prior to testing.  During performance of nerve conduction studies, distal limb temperature was maintained between 32 to 36 degrees Centigrade.     Clinical Examination   For a detailed neurologic examination, please see Dr. Lucinda Dell neuromuscular clinic note from 03/04/2022.     Motor Nerve Conduction   Nerve and Stimulus Site  Onset Latency  Amplitude  Conduction Velocity  Distance  Normal    Fibular.R/EDB    Ankle  5.6 ms  1.33 mV       90 mm  no    Fib head  14.5 ms  0.85 mV  36 m/s  320 mm  no    Pop fossa  17.7 ms  0.95 mV  41 m/s  130 mm  no    Fibular.R/TA    Fib head  3 ms  3.3 mV       105 mm  no    Pop fossa  5.4 ms  3.4 mV  54 m/s  130 mm  no    Median.R/APB    Wrist  4.1 ms  5.1 mV       70 mm  yes    Elbow  10.1 ms  4.6 mV  50 m/s  300 mm  yes    Tibial.R/AHB    Ankle  6.2 ms  0.79 mV       90 mm  no    Pop fossa  19.7 ms  0.27 mV  34 m/s  460 mm  no    Ulnar.R/ADM    Wrist  3.1 ms  9.1 mV       70 mm  yes    Bel elbow  7.5 ms  8.2  mV  52 m/s  230 mm  yes    Ab elbow  9.4 ms  7.9 mV  53 m/s  100 mm  yes        Sensory Nerve Conduction   Nerve and Stimulus Site  Segment  Onset Latency  Amplitude  Conduction Velocity  Distance  Normal    Median (Dig II).R    Mid palm  Mid palm-Dig II  1.05 ms  11 uV  57 m/s  60 mm  no    Wrist  Wrist-Dig II  2.8 ms  8 uV  50 m/s  140 mm  no         Wrist-Mid palm  46 m/s  80 mm  no    Radial.R    Forearm  Forearm-Wrist  1.8 ms  9 uV  56 m/s  100 mm  no    Sural.R    Calf  Calf-Lat mall  3 ms  3 uV  35 m/s  105 mm  yes    Ulnar (Dig V).R    Wrist  Wrist-Dig V  2.3 ms  4 uV  48 m/s  110 mm  no        F-Waves   Nerve  Latency  Normal    Median.R  36.7  no    Tibial.R  absent  yes    Ulnar.R  32.6  no        Needle EMG Data   Muscle S i d e Spontaneous Activity Motor Unit Morphology Interference Pattern     Insertional Activity Fibs/Pos. Waves Fascics Duration Amplitude Phases Activation Recruit ment   Trapezius  R  Normal  0  0  Normal  Normal  Normal  -1  Normal    Deltoid  R  Normal  0  0  +1  Normal  Normal  Normal  -1    Biceps Brachii  R  Normal  0  0  +1  +1  Normal  Normal  -1    Triceps  R  Normal  0  0  +2  +1  Normal  Normal  -1    Flexor Carpi Radialis  R  Normal  0  0  +1  +1  Normal  Normal  -1    First Dorsal Interosseous  R  Normal  0  +2  +2  +1  Normal  Normal  -2    Thoracic paraspinal  R  Normal  0  0                                  Iliopsoas  R  Normal  0  0  +1  +1  Normal  Normal  -1    Tensor Fasciae Latae  R  Normal  0  +1  +1  +1  Normal  Normal  -1    Vastus Medius  R  CRD  0  +1  +1  +1  Normal  Normal  -1    Tibialis Anterior  R  Increase  +2  0  +2  +1  Normal  Normal  -2    Medial Gastrocnemius   R  Increase  +2  0  +2  +1  +1  Normal  -2        Neuromuscluar Ultrasound        Ultrasound Study of Peripheral Nerves       Nerve  Site  Side Cross-section Area  Normal    Median  distal wrist crease  Right  12.1 mm2  yes    Median  mid forearm  Right  7.2 mm2  yes    Median   antecubital fossa  Right  7.8 mm2  yes    Median  mid arm  Right  9.6 mm2  yes    Median  axilla  Right  8.3 mm2  yes    Median  upper trunk  Right  6.3 mm2  yes    Median  middle trunk  Right  4.1 mm2  yes    Median  lower trunk  Right  5.4 mm2  yes              Interpretation & Conclusions   This is an abnormal study.      The right median and ulnar motor responses are normal. The right tibial motor response has a low amplitude with a prolonged latency and mild conduction velocity slowing. The right fibular motor response recording at the EDB has a low amplitude and a normal conduction velocity. The fibular motor response recording at the TA has a borderline low amplitude with normal conduction velocity.       The right median F-wave responses are mildly prolonged.      The right median, ulnar, and radial sensory responses have low amplitudes. The right sural sensory response is normal for age.       Neuromuscular ultrasound of the median nerve throughout the right arm shows a normal cross sectional area. The right brachial plexus also shows a normal cross sectional area.      Needle EMG examination of the right upper and lower extremity shows length-dependent reinnervation with uncompensated denervation in distal leg muscles.  All limb muscles tested are affected including deltoid in the arm and iliopsoas in the leg.  The upper trapezius and mid-thoracic paraspinals are spared.           Overall, there is electrodiagnostic evidence of a moderate, predominantly axonal, sensorimotor polyneuropathy (versus a poly-radiculo-neuropathy) with length-dependent motor axon loss, and non-length-dependent sensory axon loss.        Findings discussed with Dr Junius Roads.          Signature   This report signed electronically by    Doylene Canard, M.D. on 03/04/2022 7:38 PM       Doylene Canard, M.D. / Donald Siva R. NCS. T

## 2022-03-04 NOTE — Progress Notes (Signed)
Physical Therapy note: Jonathon Lawrence, accompanied by his wife, was seen by physical therapy during his clinic visit today.  He is here as a new patient with a chief complaint of LE fatigue and heaviness following activity. He reports this fatigue sets in after a short while of walking, predominantly in the calves, and it affects his walking by causing shuffling. On exam, he does demonstrate reduced step length and also a flexed hip posture.  He occasionally staggers, which is more observable when turning.  He is unable to walk on toes or heels, DF strength 4/5 on right side and 4+/5 on left side.  Proximal strength testing was unremarkable.  After the exam, we talked about how distal strength can impact gait and balance.  We talked about follow-up either with outpatient PT for focused gait and balance training, versus trying supportive braces at the ankles such as foot-ups.     Following our conversation, they had no further questions.     French Ana, PT, DPT

## 2022-03-05 LAB — ANTINUCLEAR ANTIBODY SCREEN: ANA Screen: NEGATIVE

## 2022-03-05 LAB — HEMOGLOBIN A1C: Hemoglobin A1C: 5.7 % — ABNORMAL HIGH

## 2022-03-06 LAB — PROTEIN ELECTROPHORESIS, SERUM
A/G Ratio: 1.4 (ref 0.9–1.8)
Albumin: 4.1 g/dL (ref 3.5–5.1)
Alpha 1: 0.3 g/dL (ref 0.2–0.4)
Alpha 2: 0.6 g/dL (ref 0.4–0.9)
Beta: 0.7 g/dL (ref 0.5–1.0)
Gamma: 1.3 g/dL (ref 0.7–1.4)
Interp,PE: NORMAL
Total Protein: 7 g/dL (ref 6.3–7.7)

## 2022-03-06 LAB — IMMUNOFIXATION ELECTROPHORESIS: Immunofixation,Serum: NORMAL

## 2022-03-07 LAB — ZINC: Zinc: 64.8 ug/dL (ref 60.0–120.0)

## 2022-03-07 LAB — COPPER, SERUM: Copper: 94.4 ug/dL (ref 70.0–140.0)

## 2022-03-08 LAB — VITAMIN B6: Vitamin B6: 43.5 nmol/L (ref 20.0–125.0)

## 2022-03-08 LAB — METHYLMALONIC ACID, SERUM: Methylmalonic Acid: 0.16 umol/L (ref 0.00–0.40)

## 2022-03-09 LAB — PE ELECT,REVIEW

## 2022-03-09 LAB — VITAMIN B1: Vitamin B1: 159 nmol/L (ref 70–180)

## 2022-03-09 LAB — IMMUNOFIX,SERUM REVIEW

## 2022-03-12 ENCOUNTER — Encounter: Payer: Self-pay | Admitting: Oncology

## 2022-03-12 NOTE — Telephone Encounter (Signed)
Called patient, left VM requesting call back to discuss genetic testing.

## 2022-03-13 ENCOUNTER — Telehealth: Payer: Self-pay | Admitting: Student in an Organized Health Care Education/Training Program

## 2022-03-13 ENCOUNTER — Other Ambulatory Visit: Payer: Self-pay | Admitting: Student in an Organized Health Care Education/Training Program

## 2022-03-13 ENCOUNTER — Encounter: Payer: Self-pay | Admitting: Oncology

## 2022-03-13 ENCOUNTER — Encounter: Payer: Self-pay | Admitting: Gastroenterology

## 2022-03-13 DIAGNOSIS — R29898 Other symptoms and signs involving the musculoskeletal system: Secondary | ICD-10-CM

## 2022-03-13 NOTE — Telephone Encounter (Signed)
I called Mr. Macintyre letting him know about his unremarkable blood work. In discussion with Dr. Junius Roads, we recommended Mr. Bride get an MRI lumbar spine with contrast to evaluate him for a polyradiculopathy. Mr. Shroff is leaving [place] to visit his newborn grandchild on 9/16 in another state. He can stay in [place] as needed until 9/23 before he leaves to see his grandchild and then go to [place] for the winter. I gave him the number for radiology to schedule his MRI and then asked him to let me know if it could not be scheduled before he leaves. We may have to arrange the scan to be in [place].    Cynda Familia MD  Neuromuscular Fellow

## 2022-03-13 NOTE — Telephone Encounter (Signed)
I called Sesar to discuss genetic testing for hereditary neuropathies.    A pedigree was obtained, which will be scanned into the patient's chart. Notable findings in the family include brother (33) with pacemaker. No noted family history of neuromuscular disease or neuropathy.    We briefly discussed causes of neuropathy.  Peripheral neuropathy, caused by damage of the nerves outside of the brain and spinal cord, often results in weakness, numbness and pain in the extremities, but can also affect other regions in the body. Neuropathy can be due to acquired causes such as diabetes, autoimmune disorders, or trauma. However, some forms of neuropathy are hereditary, meaning they are caused by pathogenic alterations (mutations) in genes related to nerve function.  Symptoms of hereditary neuropathy vary by type and may include sensory (numbness, tingling, pain) and/or motor symptoms (weakness, muscle atrophy). In some cases, the autonomic nerves (nerves related to involuntary processes in the body) may also be affected, resulting in abnormalities in blood pressure, heart rate, sweating, gastrointestinal function, and other organ systems.     Hereditary forms of neuropathy can be inherited in a dominant, recessive, or X-linked fashion. These inheritance patterns were discussed and the patient demonstrated understanding.    Lastly, we discussed the possible outcomes from this genetic test, which include a positive result, negative result, or a result with one or more variant(s) of uncertain significance. Limitations were discussed as well as familial implications of genetic testing. The patient had no questions and expressed interest in moving forward with the [facility] Comprehensive Neuropathies Panel. Informed consent was obtained from the patient and will be scanned into their chart.     The patient was informed that this testing will not be billed as it is part of the [facility] sponsored testing program. A saliva  specimen collection kit will be sent to the patient's home in [place] ([address], [place] [address]). I let the patient know they will either receive a call or we will schedule a follow-up appointment to go over the results once those are received.

## 2022-03-16 ENCOUNTER — Other Ambulatory Visit: Payer: Self-pay

## 2022-03-16 ENCOUNTER — Ambulatory Visit
Admission: RE | Admit: 2022-03-16 | Discharge: 2022-03-16 | Disposition: A | Discharge status: Home / Self Care | Payer: Medicare Other | Source: Ambulatory Visit | Attending: Oncology | Admitting: Oncology

## 2022-03-16 ENCOUNTER — Encounter: Payer: Self-pay | Admitting: Oncology

## 2022-03-16 DIAGNOSIS — M47816 Spondylosis without myelopathy or radiculopathy, lumbar region: Secondary | ICD-10-CM | POA: Insufficient documentation

## 2022-03-16 DIAGNOSIS — R29898 Other symptoms and signs involving the musculoskeletal system: Secondary | ICD-10-CM

## 2022-03-16 MED ORDER — GADOTERIDOL 279.3 MG/ML (PROHANCE) IV SOLN *I*
0.2000 mL/kg | Freq: Once | INTRAVENOUS | Status: AC
Start: 2022-03-16 — End: 2022-03-16
  Administered 2022-03-16: 18.1 mL via INTRAVENOUS

## 2022-03-18 ENCOUNTER — Encounter: Payer: Self-pay | Admitting: Gastroenterology

## 2022-03-19 ENCOUNTER — Encounter: Payer: Self-pay | Admitting: Oncology

## 2022-03-24 ENCOUNTER — Telehealth: Payer: Self-pay | Admitting: Student in an Organized Health Care Education/Training Program

## 2022-03-24 ENCOUNTER — Encounter: Payer: Self-pay | Admitting: Student in an Organized Health Care Education/Training Program

## 2022-03-24 NOTE — Telephone Encounter (Signed)
I called Jonathon Lawrence on the phone discussing his unremarkable MRI L spine results (preliminary result). Because of his EMG results concerning for a potential polyradiculopathy, I told him Dr. Junius Roads recommended getting a lumbar puncture to look for any inflammation that could be potentially treatable. We briefly discussed the risks and benefits of the procedure. He is traveling to [place] on Monday for the winter and plans on seeing his neurologist, Dr. Harriet Masson on Monday. I told him to discuss the LP with Dr. Harriet Masson at that time. I will also try to send Dr. Harriet Masson a communication. I advised [name] to still keep his appointment with Dr. Junius Roads in May.    Cynda Familia MD  Neuromuscular Fellow

## 2022-03-25 ENCOUNTER — Encounter: Payer: Self-pay | Admitting: Student in an Organized Health Care Education/Training Program

## 2022-04-15 ENCOUNTER — Other Ambulatory Visit: Payer: Medicare Other

## 2022-04-17 ENCOUNTER — Other Ambulatory Visit: Payer: Medicare Other

## 2022-04-26 ENCOUNTER — Encounter: Payer: Self-pay | Admitting: Oncology

## 2022-04-28 ENCOUNTER — Encounter: Payer: Self-pay | Admitting: Student in an Organized Health Care Education/Training Program

## 2022-05-12 ENCOUNTER — Encounter: Payer: Self-pay | Admitting: Oncology

## 2022-06-02 DIAGNOSIS — Z79899 Other long term (current) drug therapy: Secondary | ICD-10-CM | POA: Insufficient documentation

## 2022-06-13 ENCOUNTER — Encounter: Payer: Self-pay | Admitting: Oncology

## 2022-09-30 ENCOUNTER — Other Ambulatory Visit: Payer: Self-pay | Admitting: Gastroenterology

## 2022-09-30 IMAGING — MR MRI CERVICAL SPINE WITHOUT CONTRAST
7 series · 31 of 48 positions shown · IV contrast (gadolinium)
Comparison: Cervical MRI August 04, 2021

________________________________________________________________________________________________ 
MRI CERVICAL SPINE WITHOUT CONTRAST, 09/30/2022 [DATE]: 
CLINICAL INDICATION: Gait disturbance and balance difficulty, weakness after 
Covid vaccination
TECHNIQUE: Sagittal T1, Sagittal T2, Sagittal STIR, Axial TSE and Axial [id number] 
images of the cervical spine were performed without intravenous gadolinium 
enhancement.

[Series 101: survey · axial · 10.0mm · 0.94mm/px · z∈[-15,+150]mm · 4 of 9 slices shown]
[im 1/9]
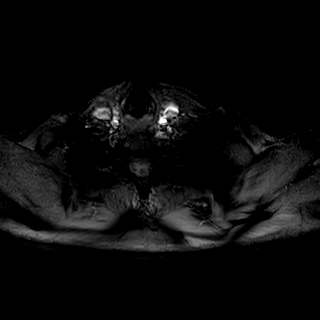
[im 3/9]
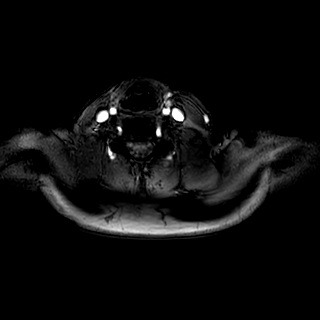
[im 6/9]
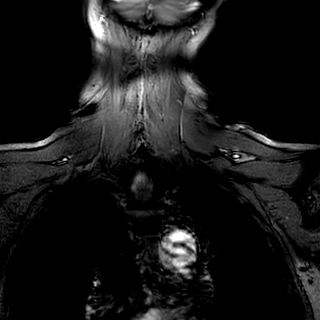
[im 9/9]
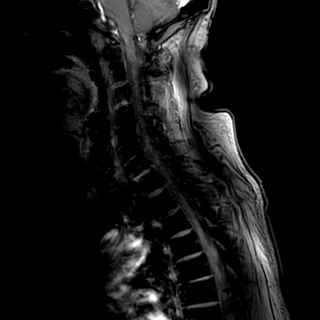

[Series 201: t2w_tse cor · coronal · 5.0mm · 0.52mm/px · 2 of 7 slices shown]
[im 1/7]
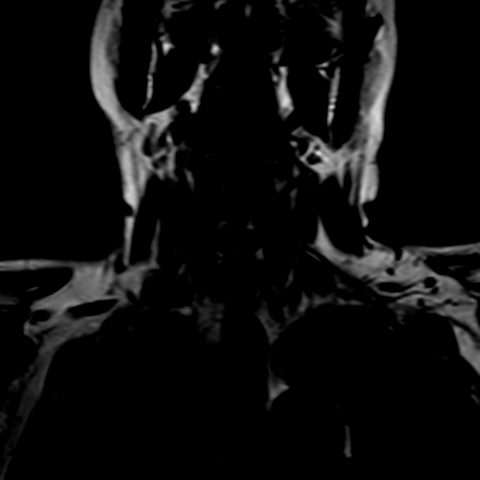
[im 7/7]
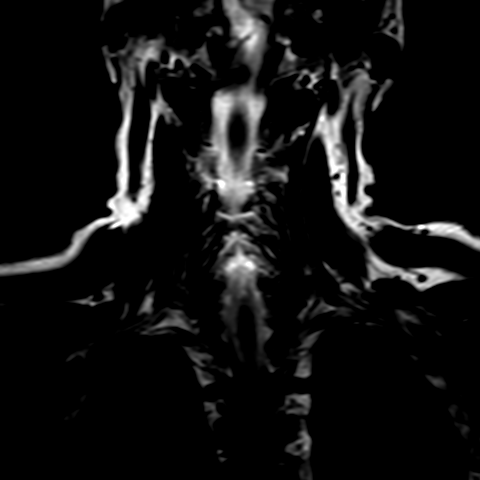

[Series 301: t1w_tse sag · sagittal · 3.0mm · 0.46mm/px · 5 of 15 slices shown]
[im 1/15]
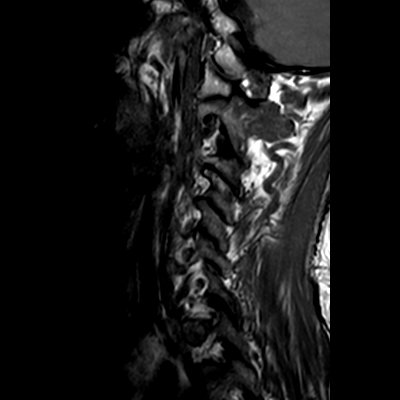
[im 4/15]
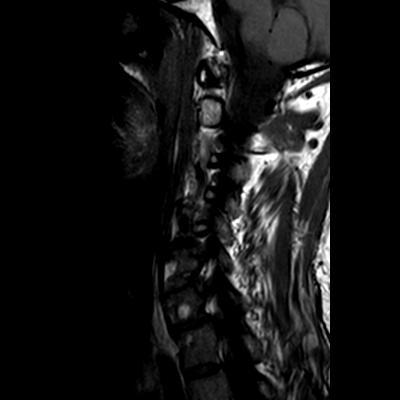
[im 8/15]
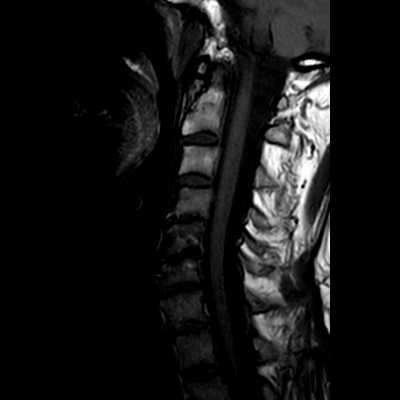
[im 11/15]
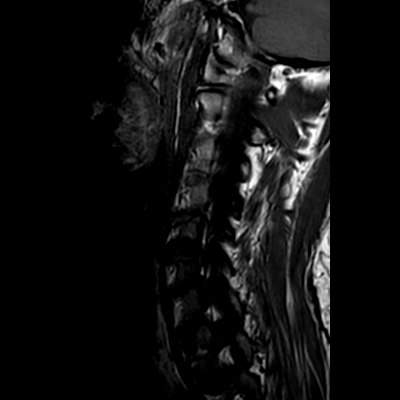
[im 15/15]
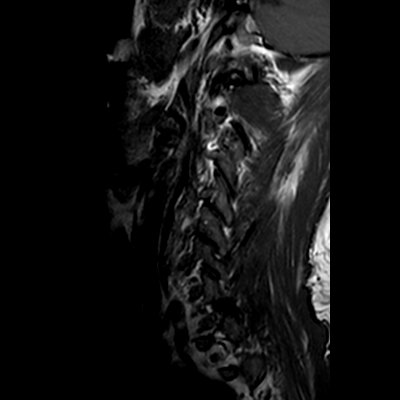

[Series 402: st2w_tse sag fs · sagittal · 3.0mm · 0.38mm/px · 5 of 15 slices shown]
[im 1/15]
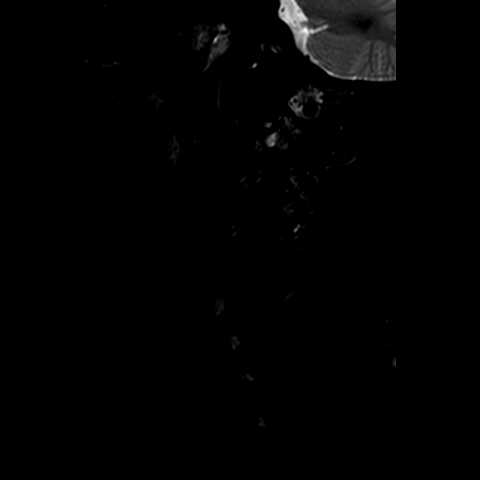
[im 4/15]
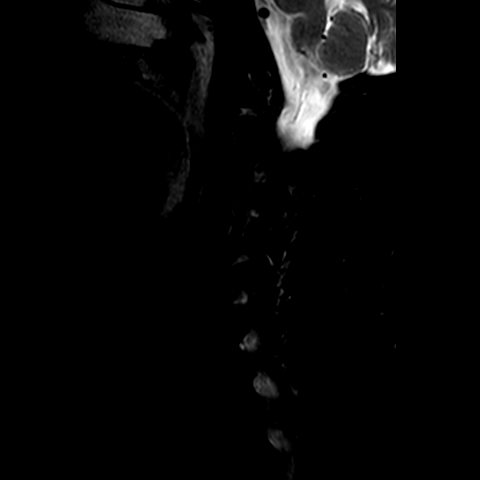
[im 8/15]
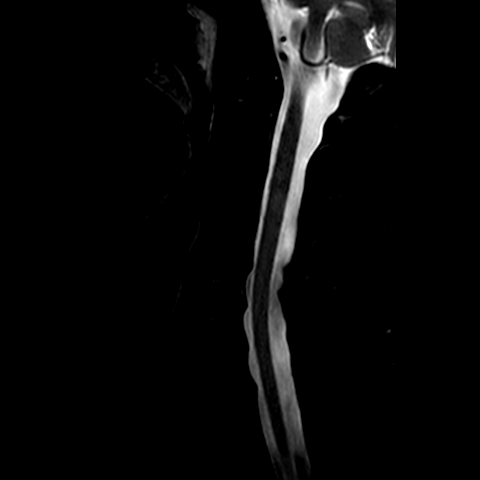
[im 11/15]
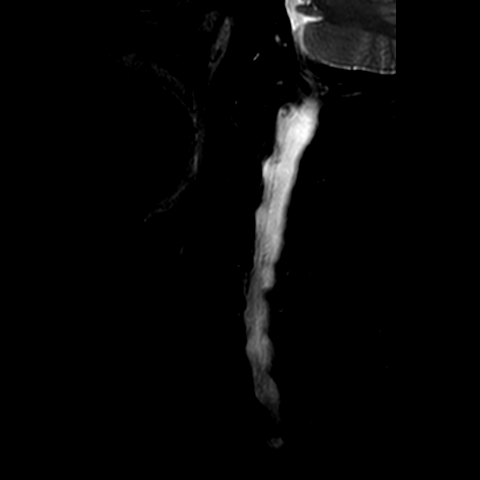
[im 15/15]
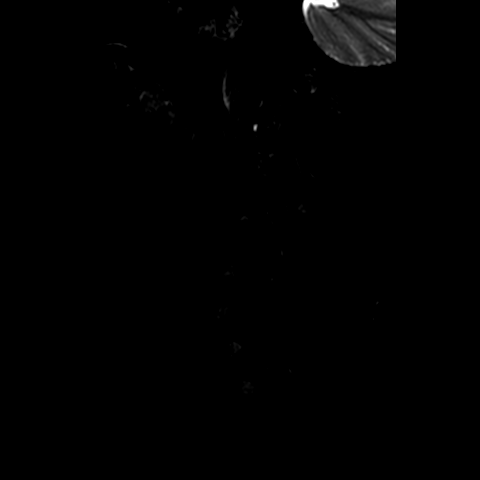

[Series 403: st2w_tse sag · sagittal · 3.0mm · 0.38mm/px · 5 of 15 slices shown]
[im 1/15]
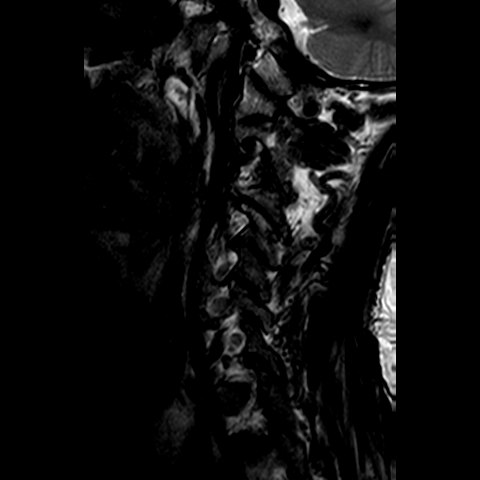
[im 4/15]
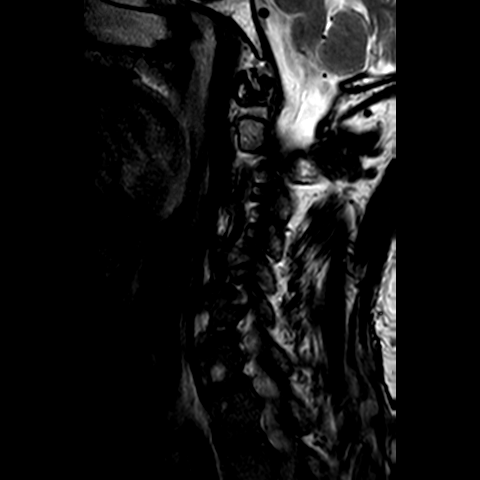
[im 8/15]
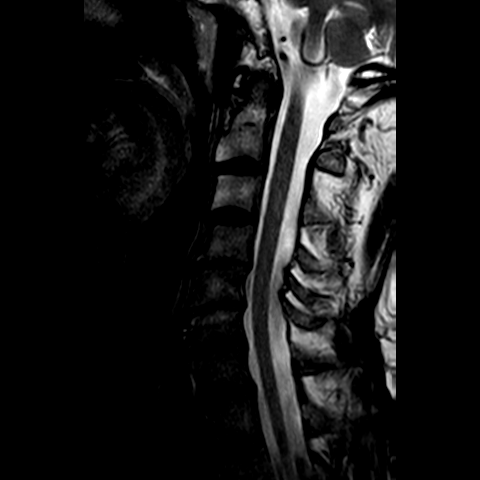
[im 11/15]
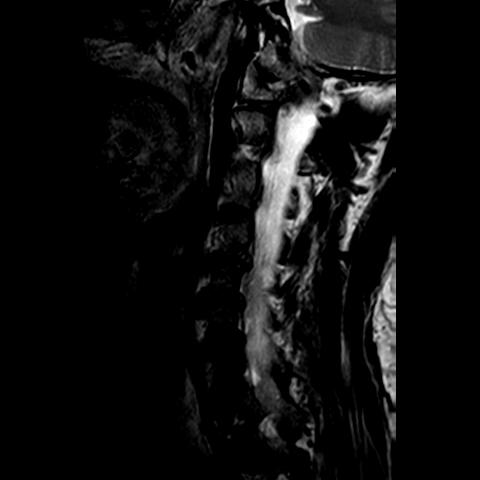
[im 15/15]
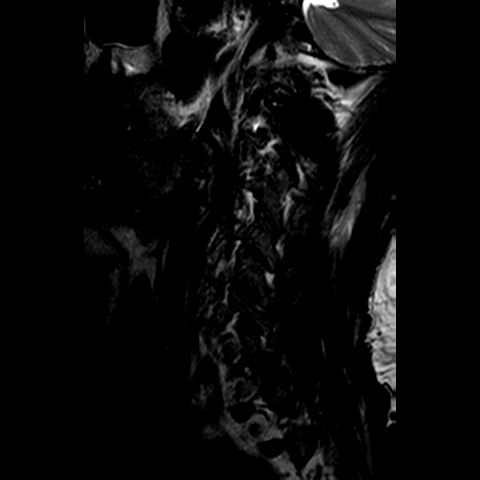

[Series 501: GRE · axial · 3.5mm · 0.67mm/px · z∈[-52,+71]mm · 8 of 30 slices shown]
[im 1/30]
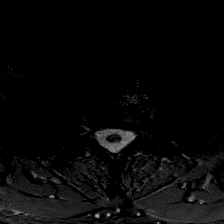
[im 6/30]
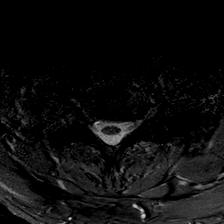
[im 9/30]
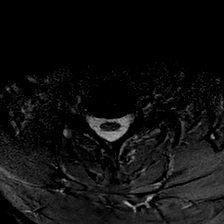
[im 12/30]
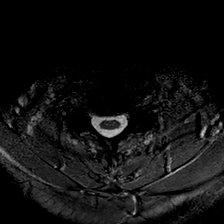
[im 18/30]
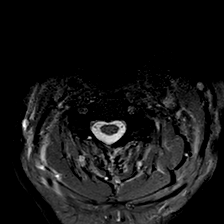
[im 21/30]
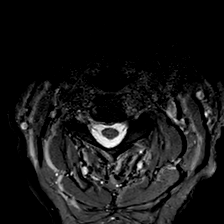
[im 24/30]
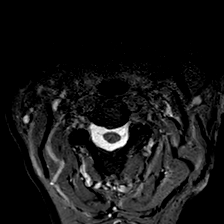
[im 30/30]
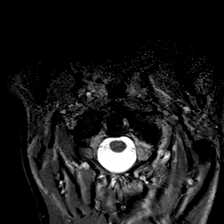

[Series 601: t2w_tse_ax · axial · 3.0mm · 0.32mm/px · z∈[-51,-32]mm · 2 of 45 slices shown]
[im 3/45]
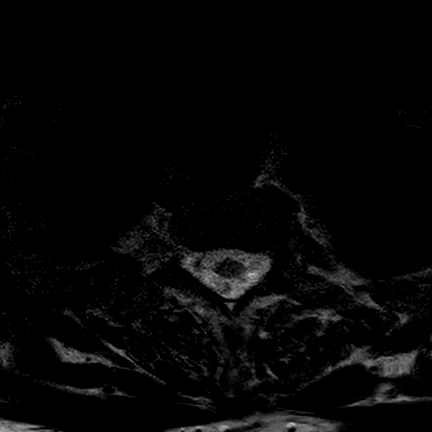
[im 9/45]
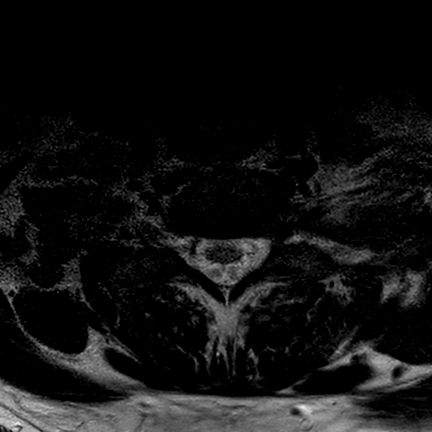

[31 of 48 positions shown; findings below may reference images not displayed]

FINDINGS: Todays study mildly limited by motion artifact. 
 Cord signal is normal. No evidence for myelitis. 
There is spondylosis, with marked disc narrowing from C4 through C7. There is 1 
mm anterolisthesis at C7-T1 and C3-4, unchanged. There is Modic type I change at 
C4-5 and C5-6. 
The dens is intact. The craniocervical junction is open. Coronal localizer shows 
mild cervical dextrocurvature. 
At C2-3 and C3-4 the canal and foramina are open. 
At C4-5 there is mild disc-osteophyte approximating the cord without deformity 
or significant canal stenosis. There is moderate to marked right, moderate left 
foraminal stenosis, not significantly different. 
At C5-6 there is moderate to marked right, mild left foraminal stenosis. Canal 
is open. 
At C6-7 the canal and foramina are open. 
At C7-T1 there is mild disc bulge with annular tear, not touching the cord. The 
canal and foramina are open.
IMPRESSION: No evidence for intramedullary signal abnormality. 
Spondylosis as described. No significant cervical canal stenosis. There is right 
greater than left foraminal stenosis at C4-5 and C5-6, not significantly 
different from the July 2021 MRI. 
Modic type I change at C4-5 and C5-6 was present previously. 
Straightened upper cervical lordosis is unchanged. There is mild cervical 
dextrocurvature scoliosis.

## 2022-09-30 IMAGING — MR MRI BRAIN WITHOUT CONTRAST
9 of 12 series · 34 of 48 positions shown · IV contrast (gadolinium)
Comparison: None.

________________________________________________________________________________________________ 
MRI BRAIN WITHOUT CONTRAST, 09/30/2022 [DATE]: 
CLINICAL INDICATION: Weakness. Gait disturbance and balance issues.
TECHNIQUE: Multiplanar, multiecho position MR images of the brain were performed 
without intravenous gadolinium enhancement. Patient was scanned on a 3T magnet.

[Series 101: survey · axial · 10.0mm · 0.98mm/px · 1 of 5 slices shown]
[im 1/5]
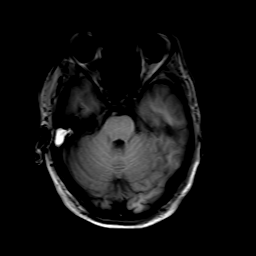

[Series 203: dadc map · axial · 4.0mm · 1.07mm/px · z∈[-23,+121]mm · 2 of 30 slices shown (1 of 2)]
[im 1/30]
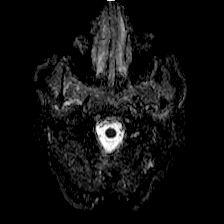
[im 30/30]
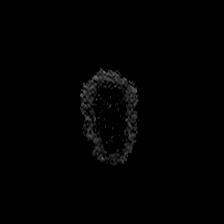

[Series 204: isob (id) · axial · 4.0mm · 1.07mm/px · z∈[-23,+121]mm · 3 of 30 slices shown (1 of 2)]
[im 1/30]
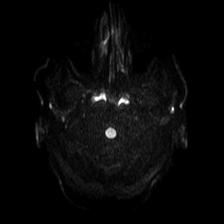
[im 15/30]
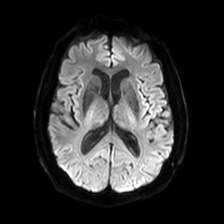
[im 30/30]
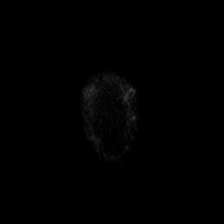

[Series 303: dadc map · coronal · 4.0mm · 0.81mm/px · 4 of 36 slices shown (2 of 2)]
[im 1/36]
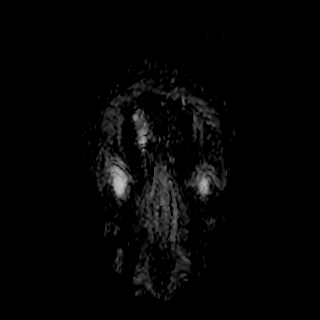
[im 12/36]
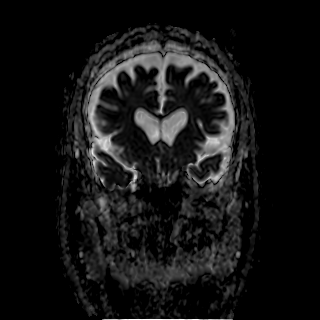
[im 24/36]
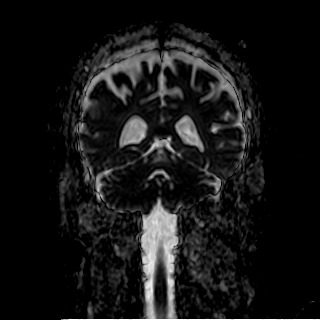
[im 36/36]
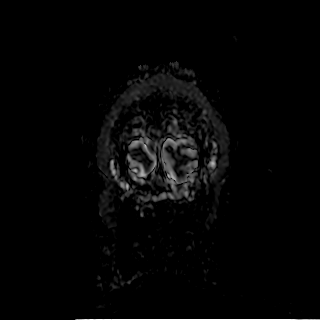

[Series 304: isob (id) · coronal · 4.0mm · 0.81mm/px · 4 of 36 slices shown (2 of 2)]
[im 1/36]
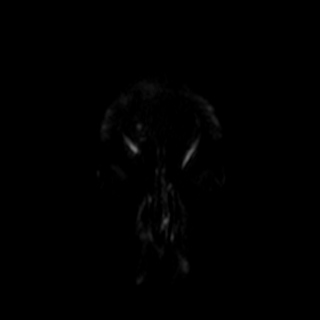
[im 12/36]
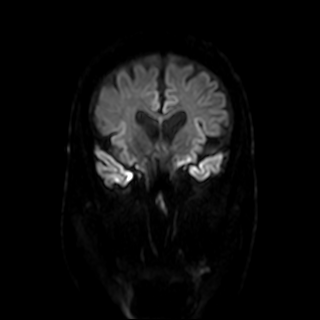
[im 24/36]
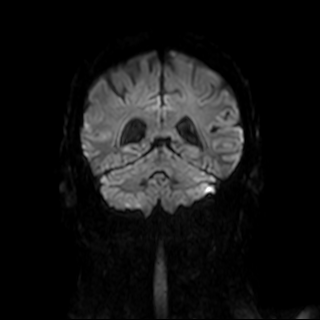
[im 36/36]
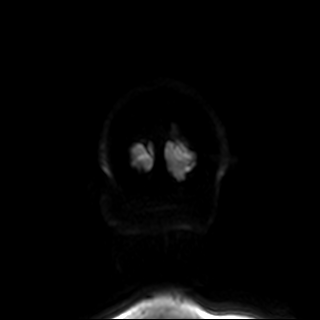

[Series 401: t1_se_sag · sagittal · 4.0mm · 0.45mm/px · 1 of 27 slices shown]
[im 1/27]
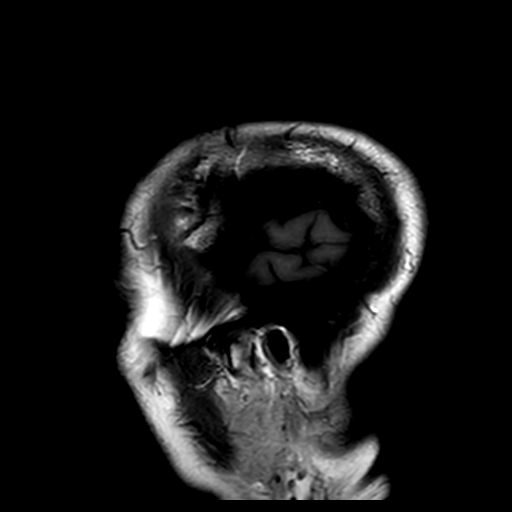

[Series 501: FLAIR fat-sat · axial · 5.0mm · 0.60mm/px · z∈[-36,+119]mm · 3 of 27 slices shown]
[im 1/27]
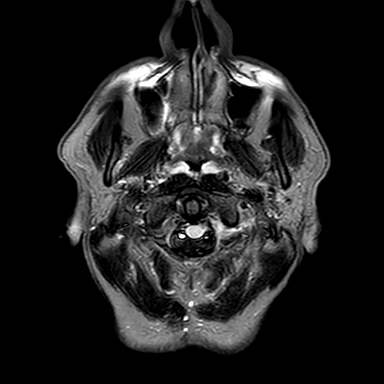
[im 14/27]
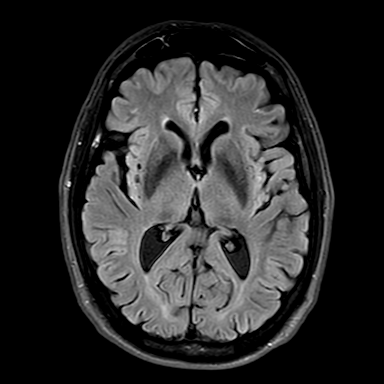
[im 27/27]
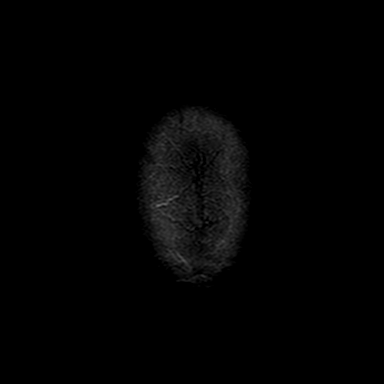

[Series 601: SWI · axial · 3.0mm · 0.53mm/px · z∈[-26,+121]mm · 8 of 100 slices shown (1 of 2)]
[im 1/100]
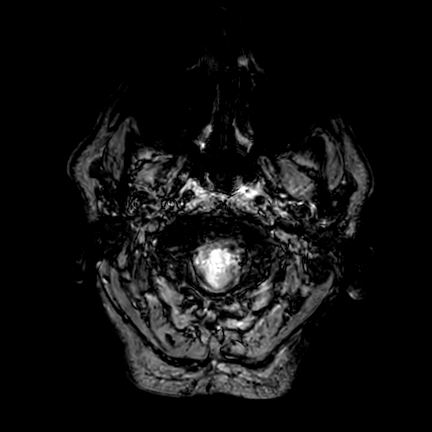
[im 12/100]
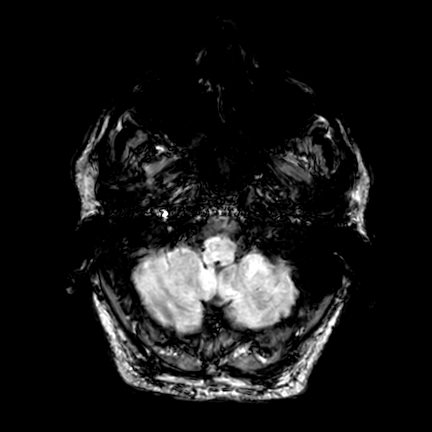
[im 34/100]
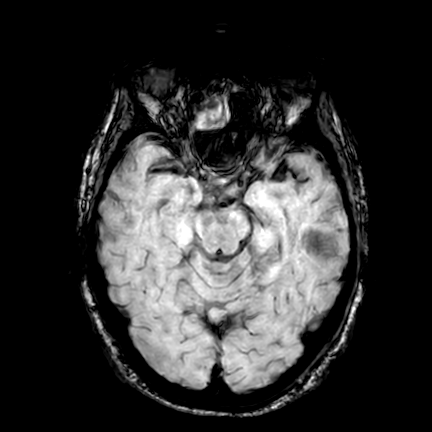
[im 45/100]
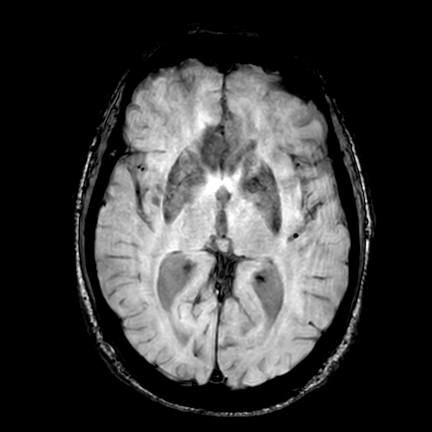
[im 56/100]
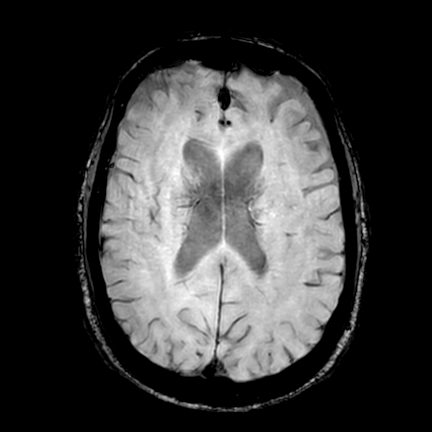
[im 67/100]
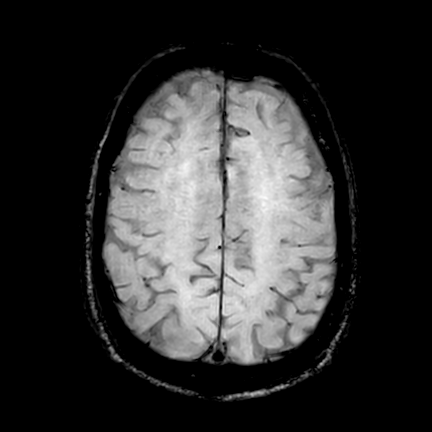
[im 89/100]
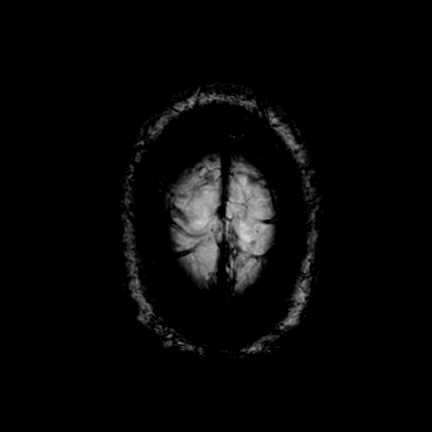
[im 100/100]
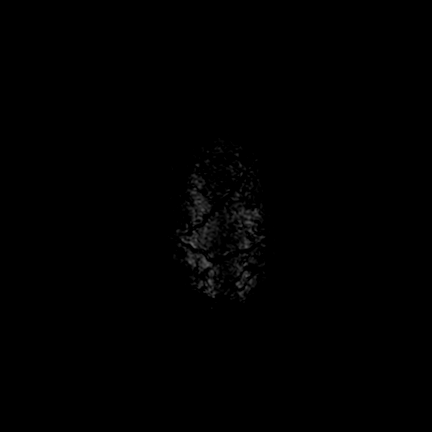

[Series 602: SWI · axial · 10.0mm · 0.53mm/px · z∈[-27,+126]mm · 8 of 78 slices shown (2 of 2)]
[im 1/78]
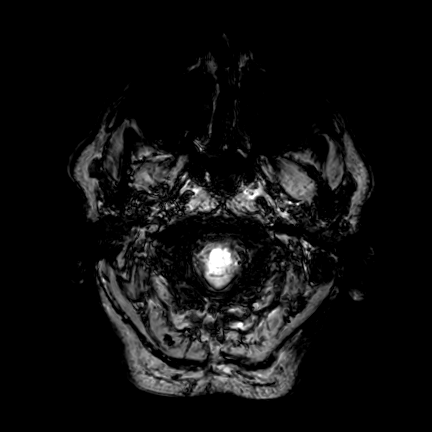
[im 12/78]
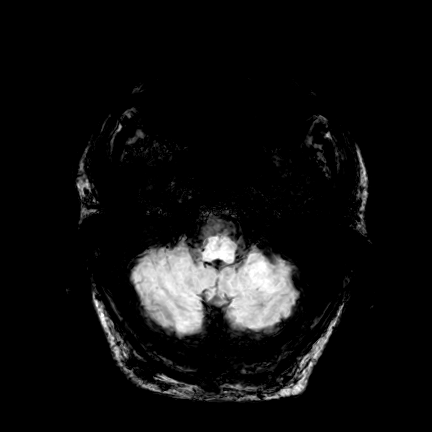
[im 23/78]
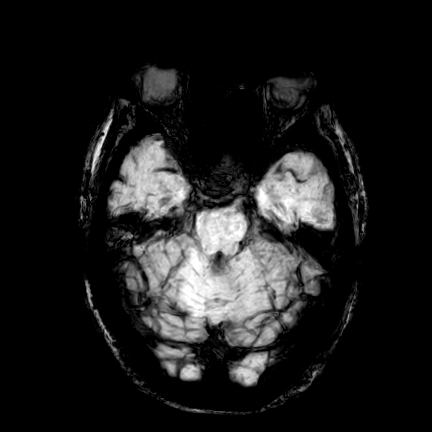
[im 34/78]
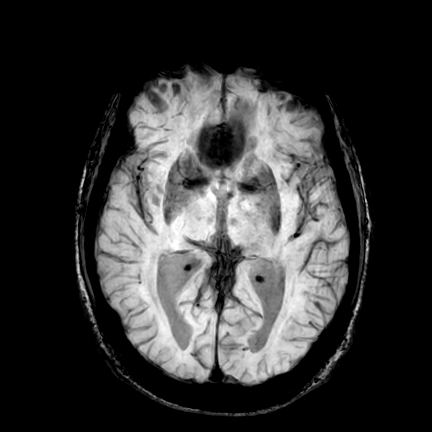
[im 45/78]
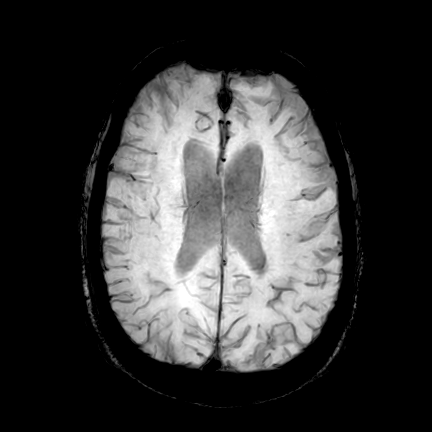
[im 56/78]
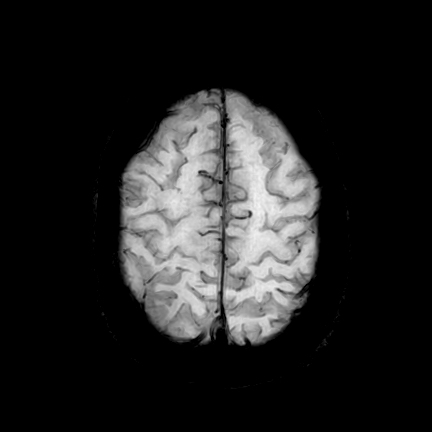
[im 67/78]
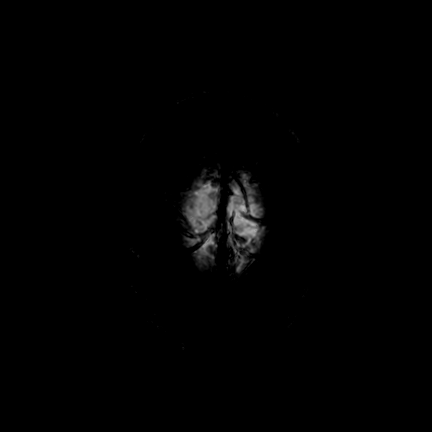
[im 78/78]
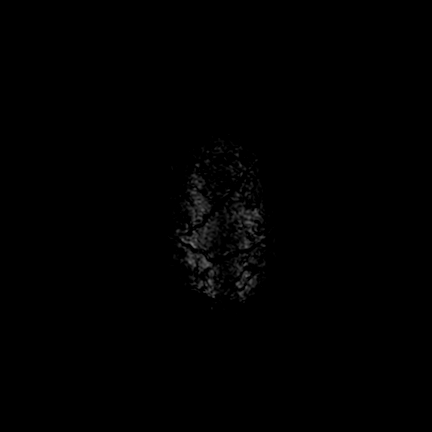

[34 of 48 positions shown; findings below may reference images not displayed]

FINDINGS: -------------------------------------------------------------------------------- 
------------------------- 
INTRACRANIAL: 
No acute ischemia. No abnormal foci of susceptibility artifact in the brain. 
Patency of intracranial vascular flow voids. Periventricular and deep white 
matter change in the frontal lobes, probably secondary to microangiopathy. This 
is very mild. No acute intracranial hemorrhage, mass effect, midline shift. Mild 
increased ventricular size and central volume loss versus underlying normal 
pressure hydrocephalus. 
-------------------------------------------------------------------------------- 
----------------------- 
OTHER: 
ORBITS/SINUSES/T-BONES:  Bilateral staphyloma, status post bilateral cataract 
extraction.  Right mastoidectomy changes with fluid signal in the residual right 
mastoid air cells, fat packing material in the mastoidectomy site.  Moderate 
mucosal thickening in the ethmoidal air cells. 
MARROW SIGNAL/SOFT TISSUES: No focal suspect signal abnormality.  
-------------------------------------------------------------------------------- 
-------------------
IMPRESSION: 1.  Central volume loss versus underlying normal pressure hydrocephalus. 
2.  Right mastoidectomy with fluid in the residual right mastoid air cells. 
3.  Moderate mucosal changes in the ethmoidal air cells.

## 2022-10-28 ENCOUNTER — Encounter: Payer: Self-pay | Admitting: Gastroenterology

## 2022-12-01 NOTE — Progress Notes (Signed)
Neuromuscular Follow Up Patient Visit Note    Dear Dr. Harriet Masson,    We had the pleasure of seeing your patient, Jonathon Lawrence, in the Neuromuscular clinic today.  He is a 72 y.o. male being seen for follow up of neuropathy. He was last seen 03/04/22.    Since his last visit, he had an unremarkable lumbar spine MRI and genetic testing. He underwent lumbar puncture in [place] with his local neurologist which showed slightly elevated protein (53). Given slight protein elevation and history, decision made to trial IVIG to assess for any improvement. IVIG was started in Nov 2023. Today, Jonathon Lawrence reports no clear improvement since starting IVIG though also notes no worsening. He has been receiving 35 g qday x4 days every 4 weeks (~2g/kg every 4 weeks). He does not have any wearing off effect between doses and is tolerating IVIG aside from a brief period of itchiness that has resolved. He was treated with topical steroids, cetirizine, and duloxetine. Now that itching has resolved, he is interested in discontinuing duloxetine.     He reports he is still able to walk 2-4 miles every day though his steps are shorter and shuffling. He feels wobbly in his lower legs and has concern for his balance. He is using one hand to go up and down stairs and feels that going down stairs is worse. He feels uncomfortable if he is standing for too long and feels like he could fall over. He has not fallen, though he did stumble once since his last visit.     He reports he did get a repeat LP recently due to MRI showing question of NPH. He denies any cognitive changes or bladder/bowel concerns. He noted no improvement in walking after large volume LP.     The Patient's problem list, allergies, medications, past medical history, past surgical history, past family history, and social history were reviewed and updated as appropriate.  Relevant findings have been noted in the HPI.  Please see the EHR for full details.    Past Medical History: s/p ablation  for Afib, s/p ablation Aflutter, myopic degeneration right eye, bilateral cataracts, macular hole, kidney stone, hypothyroidism, HLD  Past Surgical History: bilateral hip replacements  Family History: His father died from pancreatic cancer. His mother died from a pulmonary embolism and multiple myeloma. His brother has venous insufficiency. His children are healthy.  Social History:  reports that he has been smoking cigars. He has never used smokeless tobacco. Ocasional alcohol. No recreational drug use. He is retired from [employer].   Allergies:   Allergies as of 12/02/2022    (No Known Allergies (drug, envir, food or latex))      Medications:   Current Outpatient Medications   Medication Sig Dispense Refill    triamcinolone (KENALOG) 0.1 % cream SMARTSIG:1 Application Topical 2-3 Times Daily      Loratadine (CLARITIN) 10 MG tablet daily.      immune globulin, Human, (OCTAGAM) 1 GM/20ML SOLN Octagam 1 GM/20ML Intravenous Solution QTY: 0  Days: 0 Refills: 0  Written: 10/28/22 Patient Instructions: dose unknown      DULoxetine 30 mg DR capsule Take 1 capsule (30 mg total) by mouth daily.      latanoprostene bunod (VYZULTA) 0.024 % ophthalmic solution 1 drop into affected eye in the evening      levothyroxine (SYNTHROID, LEVOTHROID) 75 mcg tablet 1 tablet in the morning on an empty stomach      Multiple Vitamins-Minerals (PRESERVATION AREDS) TABS tablet  as directed       No current facility-administered medications for this visit.        Physical Examination:   BP 131/72   Pulse 58   Temp 36.7 C (98.1 F)   Wt 88.5 kg (195 lb)   SpO2 97%   BMI 26.45 kg/m     General: Pleasant and sitting comfortably  CV:  warm and well perfused  Pulm: Normal work of breathing  Extremities: high arches and hammertoes    MS: Alert and interactive. Language appears intact.  CN: Ocular versions full and conjugate. Pursuits are smooth. Facial sensation intact to light touch.  Able to raise eye brows symmetrically. Able  to bury lashes on eye closure with symmetrical resistance to opening. No ptosis. Able to puff up cheeks. Smile symmetrical with equal activation. Hearing grossly intact. Palate elevates symmetrically. Shoulder shrug strong bilaterally. Tongue strong with lateral protrusion.  No fasciculations in tongue. No atrophy of the tongue. No dysarthria.    Motor:  Tone: normal  Muscle bulk: normal  Involuntary movements: none  Muscle Right Left   Neck flexor 5    Upper extremity     Shoulder abductor 5 5   Shoulder forward flexor 5 5   Elbow flexors 5 5   Elbow extensors 5 5   Wrist flexors 5 5   Wrist extensors 5 5   Distal finger flexors  5 5   Finger extensors 5 5   Finger abductors 4+ 4+   Thumb flexor (FPL) 5 5   Thumb abductor 5 5   Lower extremity     Hip flexor 5 5   Knee flexor 5 5   Knee extensor 5 5   Ankle dorsiflexor 4+ 4+   Ankle plantarflexor 4+ 4+   Ankle inversion 5 5   Ankle eversion 5 5   Toe extensor 4 5   Toe flexor 4 4     He has difficulty walking on toes and heels. Can rise from seated position with arms crossed.      Reflex Right Left   BR 2+ 2+   Biceps 2+ 2+   Triceps 2+ 2+   Patellae 2+ 2+   Ankles 0 0     B/L Toes: going downwards    Sensory:  Pin Prick: cannot tell difference between sharp/dull in arms/legs with eyes closed. Reduced pin in to mid foot bilaterally.   Light touch: Intact and symmetric in upper and lower extremities.  Vibration (R/L, in seconds)   Great toe: 0/0   Medial malleolus: 6/6   Knee: 7/6  Position Sense: Intact at the great toes bilaterally  Romberg's sign: positive    Coordination:  Finger-Nose-Finger: Intact    Gait: Slow, decreased stride length. Unable to tandem gait.    Pertinent Results   Labs Reviewed:   Negative AChR abs 08/13/21    CK 398    CSF (04/15/22, per patient message): Prot 53  CSF (10/28/22): WBC 4, RBC 3, gluc 62, prot 54, culture neg    [facility] Comprehensive Neuropathies Panel (03/18/2022): Negative    Lab Results   Component Value Date    VB12 444  03/04/2022    MMAS 0.16 03/04/2022    FOL >14.0 03/04/2022    VITB1 159 03/04/2022    VITB6 43.5 03/04/2022    HA1C 5.7 (H) 03/04/2022    CUS 94.4 03/04/2022    INTPE Normal Pattern 03/04/2022    SIF1 Normal Pattern 03/04/2022  ANAS NEG 03/04/2022    TSH 3.26 03/04/2022    FT4 1.3 03/04/2022    ZINP 64.8 03/04/2022    ESR 12 03/04/2022    CRP <3 03/04/2022     Imaging Reviewed:   MRI L spine w/o contrast 08/08/21 by report: mild disc bulge L4-5    MRI T spine w/o contrast 08/10/21 by report: cord normal; thoracic degenerative changes    MRI L-spine w/wout (03/16/22):   Multilevel disc degenerative changes with individual levels as described above.  No abnormal enhancement within the cauda equina.  SOFT TISSUES: Marked atrophy of multiple visualized right-sided muscles including the psoas (more pronounced in the pelvis), iliacus muscle as well as the piriformis muscle. Mild near symmetrical atrophic changes in the gluteal muscles    MRI Brain without contrast 09/30/22:   Central volume loss versus underlying normal pressure hydrocephalus.    MRI C-spine w/out contrast 09/30/22:   No evidence of intramedullary signal abnormality. No significant cervical canal stenosis. There is right greater than left foraminal stenosis at C4-5 and C5-6, not significantly different from Jan 2023 MRI. Modic type 1 change at C4-5 and C5-6, present previously. Straightened upper cervical lordosis is unchanged. There is mild cervical dextrocurvature scoliosis.     Procedures Reviewed:  EMG/NCS (08/01/21, [facility])  "Normal" per EMG impression. Review of data shows absent sensory nerves in the legs and reduced amplitudes and slowing of conduction velocities in the motor nerves of the legs.    EMG/NCS 03/04/22:   This is an abnormal study.      The right median and ulnar motor responses are normal. The right tibial motor response has a low amplitude with a prolonged latency and mild conduction velocity slowing. The right fibular motor  response recording at the EDB has a low amplitude and a normal conduction velocity. The fibular motor response recording at the TA has a borderline low amplitude with normal conduction velocity.       The right median F-wave responses are mildly prolonged.      The right median, ulnar, and radial sensory responses have low amplitudes. The right sural sensory response is normal for age.       Neuromuscular ultrasound of the median nerve throughout the right arm shows a normal cross sectional area. The right brachial plexus also shows a normal cross sectional area.      Needle EMG examination of the right upper and lower extremity shows length-dependent reinnervation with uncompensated denervation in distal leg muscles.  All limb muscles tested are affected including deltoid in the arm and iliopsoas in the leg.  The upper trapezius and mid-thoracic paraspinals are spared.        Overall, there is electrodiagnostic evidence of a moderate, predominantly axonal, sensorimotor polyneuropathy (versus a poly-radiculo-neuropathy) with length-dependent motor axon loss, and non-length-dependent sensory axon loss.     Impression:     1) Sensorimotor polyneuropathy    Jonathon Lawrence is a 72 y.o. male with a sensorimotor polyneuropathy with slow progression since 2021. Overall, cause is unclear despite extensive diagnostic evaluation. There was consideration of CIDP given temporal relationship to vaccination and mild CSF protein elevation though he has received no clear benefit from trial of IVIG. Discussed at length that we would have expected some benefit from IVIG should this be a demyelinative and we are hesitant to recommend escalation of therapy at this time. Notably, he has noted relative stability since starting IVIG, so question remains if this has slowed/paused disease progression.  At this time, it is reasonable to discontinue IVIG with close follow up to assess for any interval worsening to support use of IVIG or further  immunomodulating therapy.     Also discussed that it would be reasonable to trial of duloxetine given resolution of itching. Should this recur, we could consider retrial of duloxetine.     Recommendations:     -Discontinue IVIG  - If clear worsening at next appointment, consider restarting IVIG and [place] [facility] antibody panel  - Okay to trial of duloxetine, if itching recurs could consider resuming   - Seen by PT for discussion of ambulation safety and balance exercises  - follow up in 3 months for interval exam    Thank you for allowing [place] to participate in the care of your patient.  Please call us with any questions.    Patient seen with Dr. Junius Roads, neuromuscular attending  ___ _ _ _   _ ___ _   _ _ ___   ___ _    Ferd Hibbs, MD  Neuromuscular medicine  Neurology Department

## 2022-12-02 ENCOUNTER — Ambulatory Visit: Payer: Medicare Other | Attending: Oncology | Admitting: Oncology

## 2022-12-02 ENCOUNTER — Encounter: Payer: Self-pay | Admitting: Oncology

## 2022-12-02 ENCOUNTER — Other Ambulatory Visit: Payer: Self-pay

## 2022-12-02 VITALS — BP 131/72 | HR 58 | Temp 98.1°F | Wt 195.0 lb

## 2022-12-02 DIAGNOSIS — G629 Polyneuropathy, unspecified: Secondary | ICD-10-CM | POA: Insufficient documentation

## 2022-12-02 DIAGNOSIS — R2689 Other abnormalities of gait and mobility: Secondary | ICD-10-CM

## 2022-12-02 NOTE — Progress Notes (Signed)
Physical Therapy note: Jonathon Lawrence, accompanied by his wife, was seen by physical therapy during his clinic visit.  He continues to feel unsteady when walking.  He denies any recent falls, but often feels he may fall.  He stays active by golfing or walking daily.  He does not use any braces or assistive devices at this time. On exam, he continues to demonstrate distal leg weakness with inability to raise heels or toes. When walking, he moves cautiously with a reduced pace and diminished step length.  However, when given a single walking stick, his pace and stability both improved. Therefore, recommended use of a walking stick, to which he verbalized understanding. We also talked about the potential benefits of PT services versus a home exercise program.  Based on his schedule, he wanted to try the HEP first. The HEP included 3 static exercises - narrow base of support, tandem, and single leg stance. Provided instructions on set up and exercise performance. To ensure safety, the exercises should be performed while standing in the corner with a chair in front. Following demonstration he repeated exercises. With performance, ended up changing tandem to modified tandem stance and 1 hand support should be used for both modified tandem and single leg stance. Afterwards, he felt that he could performance the exercises safely at home.     Following our conversation, they had no further questions.     Dorothe Pea, PT, DPT

## 2022-12-02 NOTE — Patient Instructions (Addendum)
We think it is okay to stop duloxetine. If you are getting significant itching, you can try the topical cream and/or Claritin to see if this helps. Otherwise, let us know and you can restart duloxetine.     We would have expected more noticeable change with IVIG, but unfortunately you have not noticed any improvement. If we stopped IVIG and you worsened, we would restart IVIG. Please call us if your symptoms worsen. We would want to see you again before you leave for [place] so we can double check your exam and determine if you need to restart IVIG or if other blood work up needs done. The fact that you didn't note improvement with IVIG, makes [place] think it is less likely to be CIDP. We can currently say that you have a peripheral neuropathy that affects the nerves that help with sensation and muscle. This affecting the axon (similar to a copper wire of an electrical wire). Your workup has otherwise been unremarkable, meaning this is most likely idiopathic (meaning we haven't found any specific cause).     Your protein elevation in your spinal fluid is only slightly increased and is not very specific. We do not need to follow this overtime.     You were seen by our physical therapist who recommended some balance exercises and recommended you try using a trekking pole to improve your mobility.     Please reach out to the clinic with any additional questions or concerns. We encourage you to sign up and use MyChart for any non-urgent medical questions as MyChart is the preferred method of communication.  Please contact your pharmacy for medication refills and allow up to 2 business days for routine prescription refills.  For urgent messages, please call the clinic at [phone].  The clinic is open from 8am to 4:30pm Monday through Friday.    You can get [facility] appointment reminders by text - please make sure we have your cell phone number on file at check in/check-out.  Then you can just text [facility] to [phone] to  receive appointment messages.

## 2023-02-17 ENCOUNTER — Encounter: Payer: Self-pay | Admitting: Oncology

## 2023-03-03 ENCOUNTER — Ambulatory Visit: Payer: Medicare Other | Admitting: Family

## 2023-03-11 ENCOUNTER — Encounter: Payer: Self-pay | Admitting: Family

## 2023-03-12 ENCOUNTER — Other Ambulatory Visit: Payer: Self-pay

## 2023-03-12 ENCOUNTER — Encounter: Payer: Self-pay | Admitting: Family

## 2023-03-12 ENCOUNTER — Ambulatory Visit: Payer: Medicare Other | Attending: Family | Admitting: Family

## 2023-03-12 VITALS — BP 148/71 | HR 51 | Temp 97.3°F | Wt 205.7 lb

## 2023-03-12 DIAGNOSIS — G6189 Other inflammatory polyneuropathies: Secondary | ICD-10-CM

## 2023-03-12 DIAGNOSIS — G629 Polyneuropathy, unspecified: Secondary | ICD-10-CM | POA: Insufficient documentation

## 2023-03-12 NOTE — Progress Notes (Signed)
Neuromuscular Follow Up Patient Visit Note    Dear Dr. Harriet Masson,    We had the pleasure of seeing your patient, Jonathon Lawrence, in the Neuromuscular clinic today.  He is a 72 y.o. 2 being seen for follow up of neuropathy. He was last seen 03/04/22.    Since his last visit, he had an unremarkable lumbar spine MRI and genetic testing. He underwent lumbar puncture in [place] with his local neurologist which showed slightly elevated protein (53). Given slight protein elevation and history, decision made to trial IVIG to assess for any improvement. IVIG was started in Nov 2023. Today, Jonathon Lawrence reports no clear improvement since starting IVIG though also notes no worsening. He has been receiving 35 g qday x4 days every 4 weeks (~2g/kg every 4 weeks). He does not have any wearing off effect between doses and is tolerating IVIG aside from a brief period of itchiness that has resolved. He was treated with topical steroids, cetirizine, and duloxetine. Now that itching has resolved, he is interested in discontinuing duloxetine.     He reports he is still able to walk 2-4 miles every day though his steps are shorter and shuffling. He feels wobbly in his lower legs and has concern for his balance. He is using one hand to go up and down stairs and feels that going down stairs is worse. He feels uncomfortable if he is standing for too long and feels like he could fall over. He has not fallen, though he did stumble once since his last visit.     He reports he did get a repeat LP recently due to MRI showing question of NPH. He denies any cognitive changes or bladder/bowel concerns. He noted no improvement in walking after large volume LP.     The Patient's problem list, allergies, medications, past medical history, past surgical history, past family history, and social history were reviewed and updated as appropriate.  Relevant findings have been noted in the HPI.  Please see the EHR for full details.  ****    He is feeling worse. He used to  walk 5 miles  and now 2-3 miles and its a struggle. Golfing, used to di 18 holes then 9 holes walking. He has to ride the 18 holes. Legs become heavy and balance is poor. He fell once two weeks ago because he tripped on the side walk when walking in the dark. He now is walking with a walking cane. Going up and down the stairs is more difficult.     In his feet, feels like he's walking on sponges. No sensory symptoms in hands. No voiced pain.     Jonathon Lawrence's concerns: HIs stride is shorter.     Regarding IVIG, he did not feel improved with his his leg weakness. Now that he's been off for 4 months he's feeling a decline. Maybe IVIG was stabilizing him.     He has a new rash to the left external thigh. Not itchy.     Past Medical History: s/p ablation for Afib, s/p ablation Aflutter, myopic degeneration right eye, bilateral cataracts, macular hole, kidney stone, hypothyroidism, HLD  Past Surgical History: bilateral hip replacements  Family History: His father died from pancreatic cancer. His mother died from a pulmonary embolism and multiple myeloma. His brother has venous insufficiency. His children are healthy.  Social History:  reports that he has been smoking cigars. He has never used smokeless tobacco. Ocasional alcohol. No recreational drug use. 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   Allergies:   Allergies as of 03/12/2023    (No Known Allergies (drug, envir, food or latex))      Medications:   Current Outpatient Medications   Medication Sig Dispense Refill    triamcinolone (KENALOG) 0.1 % cream SMARTSIG:1 Application Topical 2-3 Times Daily      Loratadine (CLARITIN) 10 MG tablet daily.      immune globulin, Human, (OCTAGAM) 1 GM/20ML SOLN Octagam 1 GM/20ML Intravenous Solution QTY: 0  Days: 0 Refills: 0  Written: 10/28/22 Patient Instructions: dose unknown      DULoxetine 30 mg DR capsule Take 1 capsule (30 mg total) by mouth daily.      latanoprostene bunod (VYZULTA) 0.024 % ophthalmic solution 1 drop into  affected eye in the evening      levothyroxine (SYNTHROID, LEVOTHROID) 75 mcg tablet 1 tablet in the morning on an empty stomach      Multiple Vitamins-Minerals (PRESERVATION AREDS) TABS tablet as directed       No current facility-administered medications for this visit.        Physical Examination:   BP 148/71   Pulse 51   Temp 36.3 C (97.3 F) (Temporal)   Wt 93.3 kg (205 lb 11.2 oz)   SpO2 97%   BMI 27.90 kg/m     General: Pleasant and sitting comfortably  CV:  warm and well perfused  Pulm: Normal work of breathing  Extremities: high arches and hammertoes    MS: Alert and interactive. Language appears intact.  CN: Ocular versions full and conjugate. Pursuits are smooth. Facial sensation intact to light touch.  Able to raise eye brows symmetrically. Able to bury lashes on eye closure with symmetrical resistance to opening. No ptosis. Able to puff up cheeks. Smile symmetrical with equal activation. Hearing grossly intact. Palate elevates symmetrically. Shoulder shrug strong bilaterally. Tongue strong with lateral protrusion.  No fasciculations in tongue. No atrophy of the tongue. No dysarthria.    Motor:  Tone: normal  Muscle bulk: normal  Involuntary movements: none  Muscle Right Left   Neck flexor 5    Upper extremity     Shoulder abductor 5 5   Shoulder forward flexor 5 5   Elbow flexors 5 5   Elbow extensors 5 5   Wrist flexors 5 5   Wrist extensors 5 5   Distal finger flexors  5 5   Finger extensors 5 5   Finger abductors 4+ 4+   Thumb flexor (FPL) 5 5   Thumb abductor 5 5   Lower extremity     Hip flexor 5 5   Knee flexor 5 5   Knee extensor 5 5   Ankle dorsiflexor 4+ 4+   Ankle plantarflexor 4+ 4+   Ankle inversion 5 5   Ankle eversion 5 5   Toe extensor 4 5   Toe flexor 4 4     He has difficulty walking on toes and heels. Can rise from seated position with arms crossed.      Reflex Right Left   BR 2+ 2+   Biceps 2+ 2+   Triceps 2+ 2+   Patellae 2+ 2+   Ankles 0 0     B/L Toes: going  downwards    Sensory:  Pin Prick: cannot tell difference between sharp/dull in arms/legs with eyes closed. Reduced pin in to mid foot bilaterally.   Light touch: Intact and symmetric in upper and lower extremities.  Vibration (R/L, in seconds)  Great toe: 0/0   Medial malleolus: 6/6   Knee: 7/6  Position Sense: Intact at the great toes bilaterally  Romberg's sign: positive    Coordination:  Finger-Nose-Finger: Intact    Gait: Slow, decreased stride length. Unable to tandem gait.    Pertinent Results   Labs Reviewed:   Negative AChR abs 08/13/21    CK 398    CSF (04/15/22, per patient message): Prot 53  CSF (10/28/22): WBC 4, RBC 3, gluc 62, prot 54, culture neg    [facility] Comprehensive Neuropathies Panel (03/18/2022): Negative    Lab Results   Component Value Date    VB12 444 03/04/2022    MMAS 0.16 03/04/2022    FOL >14.0 03/04/2022    VITB1 159 03/04/2022    VITB6 43.5 03/04/2022    HA1C 5.7 (H) 03/04/2022    CUS 94.4 03/04/2022    INTPE Normal Pattern 03/04/2022    SIF1 Normal Pattern 03/04/2022    ANAS NEG 03/04/2022    TSH 3.26 03/04/2022    FT4 1.3 03/04/2022    ZINP 64.8 03/04/2022    ESR 12 03/04/2022    CRP <3 03/04/2022     Imaging Reviewed:   MRI L spine w/o contrast 08/08/21 by report: mild disc bulge L4-5    MRI T spine w/o contrast 08/10/21 by report: cord normal; thoracic degenerative changes    MRI L-spine w/wout (03/16/22):   Multilevel disc degenerative changes with individual levels as described above.  No abnormal enhancement within the cauda equina.  SOFT TISSUES: Marked atrophy of multiple visualized right-sided muscles including the psoas (more pronounced in the pelvis), iliacus muscle as well as the piriformis muscle. Mild near symmetrical atrophic changes in the gluteal muscles    MRI Brain without contrast 09/30/22:   Central volume loss versus underlying normal pressure hydrocephalus.    MRI C-spine w/out contrast 09/30/22:   No evidence of intramedullary signal abnormality. No significant cervical  canal stenosis. There is right greater than left foraminal stenosis at C4-5 and C5-6, not significantly different from Jan 2023 MRI. Modic type 1 change at C4-5 and C5-6, present previously. Straightened upper cervical lordosis is unchanged. There is mild cervical dextrocurvature scoliosis.     Procedures Reviewed:  EMG/NCS (08/01/21, [facility])  "Normal" per EMG impression. Review of data shows absent sensory nerves in the legs and reduced amplitudes and slowing of conduction velocities in the motor nerves of the legs.    EMG/NCS 03/04/22:   This is an abnormal study.      The right median and ulnar motor responses are normal. The right tibial motor response has a low amplitude with a prolonged latency and mild conduction velocity slowing. The right fibular motor response recording at the EDB has a low amplitude and a normal conduction velocity. The fibular motor response recording at the TA has a borderline low amplitude with normal conduction velocity.       The right median F-wave responses are mildly prolonged.      The right median, ulnar, and radial sensory responses have low amplitudes. The right sural sensory response is normal for age.       Neuromuscular ultrasound of the median nerve throughout the right arm shows a normal cross sectional area. The right brachial plexus also shows a normal cross sectional area.      Needle EMG examination of the right upper and lower extremity shows length-dependent reinnervation with uncompensated denervation in distal leg muscles.  All limb muscles  tested are affected including deltoid in the arm and iliopsoas in the leg.  The upper trapezius and mid-thoracic paraspinals are spared.        Overall, there is electrodiagnostic evidence of a moderate, predominantly axonal, sensorimotor polyneuropathy (versus a poly-radiculo-neuropathy) with length-dependent motor axon loss, and non-length-dependent sensory axon loss.     Impression:     1) Sensorimotor  polyneuropathy    Jonathon Lawrence is a 72 y.o. 2 with a sensorimotor polyneuropathy with slow progression since 2021. Overall, cause is unclear despite extensive diagnostic evaluation. There was consideration of CIDP given temporal relationship to vaccination and mild CSF protein elevation though he has received no clear benefit from trial of IVIG. Discussed at length that we would have expected some benefit from IVIG should this be a demyelinative and we are hesitant to recommend escalation of therapy at this time. Notably, he has noted relative stability since starting IVIG, so question remains if this has slowed/paused disease progression. At this time, it is reasonable to discontinue IVIG with close follow up to assess for any interval worsening to support use of IVIG or further immunomodulating therapy.     Also discussed that it would be reasonable to trial of duloxetine given resolution of itching. Should this recur, we could consider retrial of duloxetine.     Recommendations:     -Discontinue IVIG  - If clear worsening at next appointment, consider restarting IVIG and Wash U antibody panel  - Okay to trial of duloxetine, if itching recurs could consider resuming   - Seen by PT for discussion of ambulation safety and balance exercises  - follow up in 3 months for interval exam    Thank you for allowing [place] to participate in the care of your patient.  Please call us with any questions.    Patient seen with Dr. Junius Roads, neuromuscular attending  ___ _ _ _   _ ___ _   _ _ ___   ___ _    Ferd Hibbs, MD  Neuromuscular medicine  Neurology Department

## 2023-03-12 NOTE — Patient Instructions (Signed)
Recommend to restart IVIG  Wash U panel   PT for gait and balance.   Water aerobics   When my note is complete I will send it to Dr. Harriet Masson in [place], [place].  Recommend derm evaluation for rash  Follow up when back from [facility].       Please reach out to the clinic with any additional questions or concerns. We encourage you to sign up and use MyChart for any non-urgent medical questions as MyChart is the preferred method of communication.  Please contact your pharmacy for medication refills and allow up to 2 business days for routine prescription refills.  For urgent messages, please call the clinic at [phone].  The clinic is open from 8am to 4:30pm Monday through Friday.    You can get [facility] appointment reminders by text - please make sure we have your cell phone number on file at check in/check-out.  Then you can just text [facility] to [phone] to receive appointment messages.

## 2023-04-01 ENCOUNTER — Encounter: Payer: Self-pay | Admitting: Oncology

## 2023-04-07 ENCOUNTER — Encounter: Payer: Self-pay | Admitting: Oncology

## 2023-04-14 LAB — SPECIAL PROCEDURE-MISCELLANEOUS SENDOUT

## 2023-04-23 ENCOUNTER — Encounter: Payer: Self-pay | Admitting: Oncology

## 2023-05-11 ENCOUNTER — Encounter: Payer: Self-pay | Admitting: Oncology

## 2023-07-13 NOTE — Progress Notes (Signed)
 NEUROLOGY [facility] CLINIC NOTE     Date of Service:  07/12/2022   Referring provider: Charolett Copes, MD  Please send a copy of this consultation to:     Reason for visit/ consult:     Sensorimotor polyneuropathy vs Idiopathic neuropathy   ? W/u for demeylianting neuropathy by [facility]     SUBJECTIVE   Jonathon Lawrence is a 73 years old M, with multiple co-morbids. Diagnosed with  sensorimotor polyneuropathy with slow progression since 2021. Overall, cause is unclear despite extensive diagnostic evaluation. There was consideration of CIDP given temporal relationship to vaccination and mild CSF protein elevation though he has received no clear benefit from trial of IVIG. He was off IVIG and now back on IVIG. If IVIG was working, should be feeling better. Seems getting " worse". He described that weakness in his feet and below knees, feels " floppy". Like " walking on something". Noticed weakness while playing golf. He has been struggle while walking. Unable to walk longtime, would need golf cart all time. 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       BACKGROUND INFO:     Diagnoses   Dr. Melvin Staff (08/01/2021) -- Quadriparesis   Dr. Delmar Ferrara [facility]; 03/04/2022) -- Peripheral Neuropathy   Dr. 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 Delmar Ferrara ([facility]; 12/02/2022) -- Peripheral Neuropathy;      Probably NOT CIDP      Date Comments     03/31/2021 Initial Dr. Emelie Hanger (Primary Doctor) Office Visit in [place], [place]   > At the end of Summer 2022 (when in [place]  for the summer), I started experiencing       daily "Lightheadedness and Leg Weakness, as well as I had Balance Concerns"   > So I scheduled this appointment with my Primary Doctor     04/09/2021 Cardiologist (Dr. Olean Berkshire) Office Visit (Recommended by Dr. Emelie Hanger)   > Conducted EKG   > Echocardiogram conducted on April 11, 2021   > Holter started on April 21, 2021   > Dr. Olean Berkshire Opinion -- Heart is not causing lightheadedness     04/17/2021 Carotid Arteries Ultrasound Conducted (Recommended by Dr. Emelie Hanger)   > Results:       - No obstruction in arteries       - There is Moderate Plaquing in carotid arteries   > Dr. Emelie Hanger comments and plan of action       - Have Bilateral Internal Carotid Stenosis 16%-49% (refer to report)       - Recommends medication: 10MG  Atorvsastatin (1 Tablet @ night)     04/22/2021 [facility] Blood Draw (Recommended by Dr. Emelie Hanger)   > Results:       - Glucose & Urea Nitrogen High   > Dr. Emelie Hanger comments       - No big deal about high results     06/24/2021 Dr. Emelie Hanger -- Annual Physical   > Discussed ongoing leg weakness and balance issues   > Dr. Emelie Hanger recommended that I schedule an appointment with Dr. Charolett Copes (Neurologist)     08/01/2021 Dr. Melvin Staff (Neurologist) Initial Office Visit   > EMG (Electromyography) conducted by technician   > Dr. Melvin Staff performed strength tests on legs and arms   > Diagnosis is Quadriparesis (also called Tetraparesis)       - Muscle weakness in all 4 limbs (both legs and both arms)     08/04/2021 Cervical  MRI Conducted @ [facility] (Recommended by Dr. Melvin Staff)   > Results: Cervical MRI normal     08/07/2021 Dr. Melvin Staff Office Visit   > Order Form sent to [facility] for thoracic and lumbar MRIs     08/08/2021 Lumbar MRI Conducted @ [facility] (Recommended by Dr. Melvin Staff)   > Lumbar MRI normal     08/10/2021 Thoracic MRI Conducted @ [facility] (Recommended by Dr. Melvin Staff)   > Thoracic MRI normal     08/13/2021 Dr. Melvin Staff Office Visit & [facility] Blood Draw   Bloodwork Results -- Creatine Kinase, Total & Myasthenia Gravis Panel 3   > Creatine Kinase high @ 398 U/L [Reference Range: 44-196 U/L]       - Creatine Kinase is an enzyme found in muscles       - Elevated levels indicative of muscle damage   > Dr. Melvin Staff not concerned with this result   > Myasthenia Gravis Panel 3: Test normal     08/27/2021 Dr. Melvin Staff Office Visit   > Prescription for  Pyridostigmine Bromide 60 MG     09/30/2021 Dr. Melvin Staff Office Visit   > Pyridostigmine Bromide Tablet appears to be helping   > May have Myasthenia       - A weakness and rapid fatigue of muscles under voluntary control.       -The condition is caused by a breakdown in communication between nerves and muscles.       - Symptoms include weakness in the arm and leg muscles.   > Pill affects Acetylcholine       - Acetylcholine is a type of chemical messenger or neurotransmitter that plays a vital role          in the central and peripheral nervous systems.       - It is important for muscle control and autonomic body functions.   > Schedule appointment with [facility] in [place], [place] over summer       - Dr. Melvin Staff wants to get a second opinion     03/04/2022 [facility] Appointment (Recommended by Dr. Melvin Staff)   > GEW Comments to Dr. Delmar Ferrara & Dr. Burdette Carolin       - Pyridostigmine Bromide Tablet appears to be NOT HELPING!       - Muscle weakness in legs still definitely exists       - Balance is an ongoing problem       - Balance off when doing simple exercises; twisting exercises; and drying off in shower       - Legs feel heavy especially in calf areas (mild throbbing)       - Difficulty in picking up feet; seem to shuffle       - Ball of both feet appear not to be flat to ground (walking on sponges)       - Difficulty with stairs; need to hold on to railing       - Difficulty walking 18 holes of golf (previously no problem)       - Walking times continue to slow; used to easily walk 5-8 miles per day; now 2-5 miles not easy   > Neuro muscular physical exam conducted by Dr. Marshall Skeeter (Fellow) and Dr. Woodford Hayward -- very thorough examination       - Also met with Physical Therapist   > Test #1 -- Nerve Conduction Study (NCS) performed by Technician Nelva Bang)       -  Performed to measure how well an electrical impulse moves through the nerve       - Nerve is  stimulated with a very mild electrical impulse        - Electrical activity is recorded using electrodes that are placed on the skin   > Test #2 -- Electromyography (EMG) performed by Technician Nelva Bang) and Dr. Jennine Mohair       - Measures the electrical activity in the muscles using a small needle placed under the skin          in the muscle   > Test #3 -- Ultrasound performed to trace nerve from hand to spinal cord    > All tests help find the presence, location, and extent of injury that damage the nerves / muscles   > Diagnosis: Peripheral Neuropathy       - Happens when the nerves that are located outside of the brain and spinal cord (peripheral          nerves) are damaged       - The peripheral nervous system sends information from the brain and spinal cord to the rest          of the body through motor nerves   > Dr. Delmar Ferrara Comments       - Tests definitely showing damage to nerves in both legs and arms       - Need to find the cause of nerve damage          - Cause not found for about 30% of cases       - Stop taking Pyridostigmine Bromide tablets -- don't seem to be helping   > Next Steps       - Blood Tests: 10 vials of blood drawn at hospital       - Genetic Test Kit to be mailed to [facility] and then mail it back to [facility]       - Follow-up visit scheduled for Wednesday, Dec 02, 2022 at 11:00 AM     03/13/2022 [facility] Bloodwork Results -- Dr. Burdette Carolin Telephone Call   > All bloodwork results are normal   > Dr. Delmar Ferrara wants to conduct a MRI Lumbar Spine (with contrast) at a [facility] facility     03/16/2022 Lumbar Spine MRI @ [facility]   > MRI with contrast conducted   > Dr. Burdette Carolin called with results on 03/24/2022       - MRI L Spine results were unremarkable; no significant spinal canal narrowing / pinched nerves       - Dr. Delmar Ferrara recommends a lumbar puncture (spinal tap) to look for any inflammation that          could be treatable       - Lumbar puncture can be performed in  [place]  -- discuss with Dr. Melvin Staff     03/18/2022 [facility] Recommendation for Genetic Test    > Genetic Test Kit from [facility] received on 03/18/2022       - Sent Genetic Test Sample to [facility] on 03/18/2022   > [facility] Clinical Summary received on 04/03/2022       - Diagnostic test evaluated 102 genes for variants that are associated with genetic disorders       - Genetic Test Results were NEGATIVE! No significant genetic changes or mutations          were found     03/30/2022 Dr. Melvin Staff Office Visit   >  Discussed [facility] tests and results   > Dr. Melvin Staff sent "Referral" to [facility] for a Lumbar Puncture (Spinal Tap)     04/14/2022 [facility] Blood Test for Lyme Disease   > [facility] report indicated Lyme AB Screen is NEGATIVE     04/15/2022 Lumbar Puncture Procedure @ [facility]   > Results communicated by Dr. Melvin Staff on 04/23/2022       - Lumbar Puncture indicated no inflammation       - Lumbar Puncture indicated no Lyme Disease       - Lumbar Puncture indicated elevated protein levels (53 vs. 45)          - Exercise leads to elevated levels          - Dr. Melvin Staff not concerned     04/23/2022 Dr. Melvin Staff Office Visit   > Need to increase fruits and vegetables to daily nutrition       - Dr. Melvin Staff agreed that trying something like Balance of Lonne Roan would not hurt   > Dr. Melvin Staff agreed that Weight Training may help   > Dr. Melvin Staff said Hemoglobin A1C blood test from [facility] was high (5.7%)      - Values of 5.7%-6.4% indicate an increased risk for developing Diabetes Type 2      - Dr. Melvin Staff says I may be Pre-Diabetic Type 2      - Dr. Melvin Staff says I should meet with Dr. Emelie Hanger regarding A1C      - There is a pill (Metformin) that may help -- effectively lowers blood glucose levels by          decreasing glucose production in the liver, diminishing intestinal absorption, and enhancing          insulin sensitivity   > Next Steps       1. Schedule appointment with Dr.  Emelie Hanger (will occur on 04/29/2022)       2. Follow-up Office Visit with Dr. Melvin Staff at 9:00 AM on Monday, October 20, 2022     04/29/2022 Dr. Emelie Hanger Office Visit   > Discussed Leg Weakness/Balance History       - Dr. Emelie Hanger Comments          - Had me walk across room          - Worrisome          - Continue with Neurologists          - Discussed [facility] possible tests: IVIG & Demyelinating Neuropathy Panel          - Not typical peripheral neuropathy pattern; possibly something more rare?   > GEW Question: What are your thoughts regarding A1C & Pre-Diabetes?       - Dr. Emelie Hanger Comments          - Unlikely cause          - Will test A1C as part of bloodwork for Annual Physical on 07/16/2022   > GEW Question: What other tests can/should be pursued?       - Dr. Emelie Hanger Comments          - Pursue testing recommended by Neurologists   > GEW Question: Should I take a fruit/vegetable supplement (e.g. Balance of Nature)?       - If yes, Do you have a recommended product?       - Dr. Emelie Hanger Comments          - Doubt pill(s) will help  your situation; may just hurt your wallet          - Refer to Recommended "Treatment For Neuropathy" List; may increase your energy levels   > GEW Question: Should I be seeing Dr. Melvin Staff sooner than 10/20/2022?       - Dr. Emelie Hanger Comments          - Give Dr. Marlis Simper message about additional tests to Dr. Melvin Staff           - Get Lab tests done          - Try to move up appointment with Dr. Melvin Staff   > Dr. Emelie Hanger Additional Comments       - Let's try stopping taking Atorvastatin          - Higher cholesterol levels will probably show up in bloodwork in January 2024          - Can always go back on after your Annual Physical on 07/16/2022     05/12/2022 Dr. Melvin Staff Office Visit   > Met with Dr. Melvin Staff to discuss [facility]'s suggested possible next steps   > Dr. Melvin Staff opined the Demyelinating Neuropathy Panel is not 100% accurate and probably       not worth pursuing   > Dr. Melvin Staff opined that  [facility] is being aggressive with the IVIG approach       - We discussed this procedure at great length and it is up to me if wanting to pursue   > I followed up with Dr. Delmar Ferrara [place] Drop) one more time about IVIG       - Dr. Pleasant Brilliant comments (05/13/2022) -- "Again, I am not sure if there is some inflammatory          process causing your neuropathy, but your story is convincing enough that I would offer          you a trial of IVIG if you were seeing me [place] in [place]. If you receive monthly infusions          for the next 6 months and nothing changes, then we wouldn't have to keep going with it, but          if we notice an improvement, then I would want to keep treating you with it."     05/14/2022 Dr. Harlie Lieu IVIG Referral to [facility] [place]  [facility]   > Talked to [place] on 05/18/2022 and the Consultation appointment will be on 06/02/2022       and the IVIG infusions will be for 4 days (November 28 to June 05, 2022)   > I dropped off the [facility] medical information and this summary on 05/19/2022   > [facility] is working with Dr. Cathryne Cobble office to obtain my medical information     06/02/2022 IVIG Infusions at [place]  [facility]  to  > Dosage: "2" Grams Per Kilogram (180 Grams) over "4" consecutive days (45 Grams/Day)  11/19/2022 > Drug Name: Octagam   > 06/02/2022: Met briefly with Dr. Soriano at [facility] to explain the process      - Interesting comment from Dr. Allean Aran: Asked if I thought it may be due to COVID vaccines?   > Round #1 infusions occurred on 06/02/2022, 06/03/2022, 06/05/2022 and 06/09/2022   > Round #2 infusions occurred on 06/30/2022, 07/01/2022, 07/02/2022 and 07/03/2022   > Round #3 infusions occurred on 07/28/2022, 07/29/2022, 07/30/2022 and 07/31/2022   > Round #4 infusions occurred on 08/25/2022, 08/26/2022, 08/27/2022 and  08/28/2022   > Round #5 infusions occurred on 09/22/2022, 09/23/2022, 09/24/2022 and 09/25/2022   > Round #6 infusions occurred on  10/20/2022, 10/21/2022, 10/22/2022 and 10/23/2022   > Round #7 infusions occurred on 11/16/2022, 11/17/2022, 11/18/2022 and 11/19/2022     07/21/2022 Dr. Melvin Staff Office Visit -- Comments & Next Steps   > Dr. Melvin Staff appeared ready to stop IVIG since I did not have the "WOW FACTOR" (all better)       - Although I am not experiencing it, there is "RISK" to IVIG since it is tampering with          my immune system       - A slight improvement is not what we are trying to accomplish!   > Dr. Melvin Staff agreed that I should do IVIG through April 2024 and then assess   > Dr. Melvin Staff not concerned about [facility] bloodwork being slightly out of range!   > [facility] may want to perform the same tests as in August 2023 in my May 2024 visit   > There are other things to try if IVIG does not work -- both intravenously and orally!   > Dr. Melvin Staff wants to see me after Round #6 of IVIG in April and after my [facility] bloodwork       in April 2024     09/10/2022 Dr. 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 Melvin Staff opines that the itchiness is nerve irritation related       - Prescription for Duloxetine sent to [facility] that may help       - If medication helps, may have to stay on medication long-term   > [facility] is more concerned about my slightly elevated protein levels and [facility] is more aggressive        with treatment   > I asked if my condition could be brain related       - Dr. Melvin Staff opined that probably not; however, Dr. Melvin Staff is sending me to [facility] for:          - MRI Cervical Spine Without Contrast          - MRI Brain Without Contrast     09/14/2022 Dermatologist Office Visit for Itchiness   > PA tested skin extractions -- no critters, bacteria or fungi   > PA took biopsy and sent to lab in [place]       - Results indicated that  itchiness may be do to IVIG   > PA prescribed Triamcinolone Acetonide Cream to apply on itchy areas as needed     09/22/2022 Michel Agreste APRN Office Visit at [facility]   > Discussed itchiness   > Michel Agreste opined that it could be IVIG related   > Recommended that I take Claritin daily     09/30/2022 Cervical Spine MRI Without Contrast Conducted @ [facility] (Recommended by Dr. Melvin Staff)   > Findings:       - No evidence for intramedullary signal abnormality       - Spondyiosis, with marked disc narrowing from C4 through C7       - No  significant cervical canal stenosis       - There is right greater than left foraminal stenosis at the C4-5 and C5-6       - Modic type 1 change at C4-5 and C5-6        - There is mild cervical dextrocurvature scoliosis   > Question: Any additional comments/concerns from Cervical MRI?       - Dr. Melvin Staff said Cervical Spine MRI was unremarkable!     09/30/2022 Brain MRI Without Contrast Conducted @ [facility] (Recommended by Dr. Melvin Staff)   > Findings:       - Intracranial -- No acute ischemia. No abnormal foci of susceptibility artifact in the brain.          Patency of intracranial vascular flow voids. Periventricular and deep white matter change in          the frontal lobes, probably secondary to microangiopathy. This is very mild. No acute          intracranial hemorrhage, mass effect, midline shift. Mild increased ventricular size and           central volume underlying normal pressure hydrocephalus.       - Orbits/Sinuses/T-Bones -- Bilateral staphyloma, status post bilateral cataract extraction.          Right mastoidectomy changes with fluid signal in the residual right mastoid air cells, fat          packing material in the mastoidectomy site. Moderate mucosal thickening in the          ethmoidal air cells       - Marrow Signal/Soft Tissues -- No focal suspect signal abnormality       - Impression -- Central volume loss versus underlying normal pressure hydrocephalus;  right          mastoidectomy with fluid in the residual right mastoid air cells; moderate mucosal changes          in the ethmoidal air cells   > Question: Any additional comments/concerns from Brain MRI?       - Dr. Melvin Staff said Brain MRI was unremarkable!     10/28/2022 Lumbar Puncture at [facility]   > Physical Therapist did some balance tests before and after Spinal Tap   > Question: Was the pressure tested within the normal range?       - Pressure was 16, which is in the normal range of 6-25   > Question: Any findings regarding Normal Pressure Hydrocephalus (NPH) tests?       - NPH findings were microscopic       - Definitely no shunt -- possibly re-check in several years   > Question: Any additional comments/concerns from Lumbar Puncture?       - NPH test was driven by Brain MRI finding of Neuro-radiologist       - This LP was different than the LP in October 2023     10/28/2022 Bloodwork discussed at Dr. Emelie Hanger Office Visit   > Everything back to normal; except Protein levels slightly elevated   > Question: Have blood test results been impacted by IVIG infusions?       - Dr. Emelie Hanger -- IVIG may be helping?       - SED Rate (22 vs. 20 -- was 116), CK and others back in normal range   > Dr. Melvin Staff said elevated protein levels are minor (54 vs. 45) and ignore it unless rises  significantly   > Question: Should bloodwork be re-tested if/when IVIG infusions are stopped?       - Dr. Melvin Staff said see if [facility] wants to stop IVIG     11/04/2022 Dr. Wanita Gutta (Rheumatologist) Office Visit   > Dr. Emelie Hanger suggested that I see a Rheumatologist to rule out Myopathies   > Dr. Wanita Gutta opined to defer to Neurologists, since there are no Myopathy issues/concerns   > Dr. Wanita Gutta said to ask Dr. Melvin Staff       - Is IVIG working based on recent bloodwork?          - Dr. Melvin Staff said IVIG may be working, but defers continuation to [facility]       - What are options to treat elevated protein?          - Dr. Melvin Staff  said elevated protein levels are minor and would not prescribe any treatment     11/09/2022 Dr. Melvin Staff Office Visit -- Comments & Next Steps   > Dr. Melvin Staff wants to see what [facility] recommends about IVIG or other treatments       - [facility] is more aggressive in approach for treatment   > Dr. Melvin Staff does not want to prescribe something that may do more harm than good (risk)   > Next appointment scheduled for 9:45 AM on 04/15/2023     12/02/2022 [facility] Appointment Details   > First -- Met with Dr. Johna Myers (Fellow)        - Discussed history since last visit on 03/04/2022       - GEW Question: What tests will be conducted today?          - Dr. Johna Myers -- Nerve Conduction Study, Electromyography (EMG) and Ultrasound will not be             performed today       - GEW Question: Any more blood tests today?          - Dr. Johna Myers -- No blood tests scheduled for today       - GEW Question: Should Electrical Shock Treatments be pursued?          - My brother has a friend who recently got neuropathy in his feet (pretty severe) and bought an             electrical device to shock his feet. Now he is all better.          - Dr. Johna Myers -- Acupuncture and TENS usually only done for pain       - Performed strength tests of arms and legs       - Performed sensory testing on legs and feet (e.g., pin sharp or dull)       - Dr. Johna Myers to discuss with Dr. Delmar Ferrara and both will come back!   > Second -- Met with Peterson Brandt (Physical Therapist)       - Discussed if any changes since 03/04/2022       - Observed me walking in halfway, with and without walking poles       - Performed some balance testing       - Peterson Brandt said that I have ankle weakness       - GEW Question: Is Physical Therapy an option to consider for gait issues and balance?          - Peterson Brandt -- I could write you a script for PT or you could do exercises at  home             - I decided on a home exercise program       - Nicole's Suggestions:          - When walking for exercise, use a walking  pole             - Walking/Balance improved significantly when using walking pole          - Provided me with a "Home Exercise Program" to do on a daily basis             - "3" exercises that should be done every day             - Preferable to do exercises in bare feet vs. wearing shoes   > Third -- Met with Dr. Delmar Ferrara and Dr. Johna Myers       - Discussed current situation on leg weakness and balance       - Question: Should I continue IVIG infusions?         - Dr. Delmar Ferrara -- IVIG doesn't appear to be helping; discontinue and re-assess after            3 months!       - GEW Question: Should I discontinue using Duloxetine since I currently don't have itchiness?          - Dr. Delmar Ferrara -- OK to discontinue; if itchiness returns, use Claritin and/or Triamcinolome             Acetonide Cream       - GEW Question: Has bloodwork been impacted by IVIG infusions?          - Dr. Delmar Ferrara -- No need to redo bloodwork at this time; protein levels are slightly elevated;             not concerned, if protein levels were 100 then there would be concern       - GEW Question: Dr. Melvin Staff indicated that I may have CIDP. What does [facility] think?          - Dr. Delmar Ferrara -- The fact that you have not significantly improved from the IVIG infusions             seems to mean that IVIG is not helping; IVIG is about 80% successful with CIDP;             appears that I do not have CIDP; reassess if need to go back on IVIG in 3 months or with             Dr. Melvin Staff; not seeing results after 7 months -- would expect significant improvement       - GEW Question: Any other infusions or medications instead of IVIG?          - Dr. Delmar Ferrara -- There are other options, but do not want to pursue at this time; aggressive              treatments, but there is high risk (blood clots, kidney, etc.)       - GEW Question: When people ask what is wrong with me, what should my response be?          - Dr. Delmar Ferrara -- Say you have Peripheral Neuropathy; probably  not CIDP; I am idiopathic             (cause unknown); occurs in approximately 30% of Neuropathy cases; currently  the goal             is to stay status quo; can't reverse it right now; reassess in 3 months!       - GEW Question: What are the NEXT STEPS from [facility]'s perspective?          - Dr. Delmar Ferrara -- Follow-up visit scheduled for 10:15 AM on Friday, September 6     03/12/2023 [facility] Appointment Questions / Details -- Appointment With General Kenner (Nurse Practitioner)   > I sent a message via [facility] on 02/17/2023 saying "Since stopping IVIG, I believe my situation       is slightly worsening. My reasoning is that the legs are still heavy when walking and my balance       is not improving. I only walk about 4 days per week for 2-3 miles. Last year, I was walking almost       every day for 5-8 miles. In golf, I no longer attempt to walk 9 or 18 holes -- difficult and no fun."       - Estil Heman responded on 02/18/2023 saying "it would be helpful for us  to get an updated exam          to help understand the extent of worsening and have something to compare to in the future."          - GEW Question: Will an updated exam be conducted today?             - Heather conducted multiple tests to establish a baseline:                1.) Walking down hallway for time                2.) Sit to Stand 5 times                3.) Hand Strength -- Squeezing a Grip                4.) Baseline/Functional Testing -- Arm & Leg Strength; Vibration; Pin Prick, etc.       - Estil Heman also said "it would be helpful to get the [facility] antibody panel. This has to          be specifically requested, so I'll have our team start the process so that we can draw that lab          at your appointment."          - GEW Question: Will blood be drawn today for the [facility] antibody panel?             - Libby Ree (Nurse) drew one vial of blood; Expect results in 4-5 weeks          - GEW Question: What do you expect to learn from this  antibody panel?             - Antibody Panel checks for abnormal antibodies and antigens   > GEW Comment: At times, I experience light-headedness. Especially when turning.        - GEW Question: Is this a symptom of something?          - Mentioned, but not discussed in detail   > GEW Comment: My balance is worsening when doing turning exercises and drying off in the       shower?       - GEW Question: Would Physical Therapy help?  Should I discuss this with Dr. Melvin Staff?          - Heather recommends PT to work on gait and balance; discuss with Dr. Melvin Staff   > GEW Comment: Dr. Delmar Ferrara indicated that I probably don't have CIDP.       - GEW Question: If I don't go back on IVIG, what else can be done? I prefer to do something          versus do nothing!          - Heather recommends re-starting IVIG; Pattie Borders will discuss with Dr. Delmar Ferrara             - Dr. Delmar Ferrara will recommend to continue Loading Dose or start Maintenance Dose;                Dosage amount and timing could change from 4 weeks / 4 days to something else             - Pattie Borders suggests adding a IV Steroid to pre-meds to alleviate itching          - Pattie Borders indicated other option is used only in extreme cases of neuropathy; very risky from             an immunity standpoint; could get very sick   > [facility] Recommendation to Re-Start IVIG       - Dosage: 1 Gram per Kilogram (90 Grams) for one day every 3 weeks   > GEW Comment: I am experiencing skin issues on my legs and feet. Showed NP my skin issues!       - GEW Question: Is this a symptom of something? Should I schedule a visit with my Dermatologist?          - Yes, schedule an appointment with Dermatologist in [place] !   > GEW Question: What exercises / physical activity do you recommend I do?       - Recommend some form of water aerobics to work on balance at the [facility]   > GEW Question: Does [facility] want me to schedule a follow-up visit for May 2025?       - Follow-up Appointment scheduled for 10:30 AM on  Wednesday, December 08, 2023     03/30/2023 IVIG Infusions Resumed at [place]  [facility]  to  > Dosage: "1" Gram Per Kilogram (90 Grams) on "1" day every "3" weeks  07/02/2023 > Drug Name: Gammagard   > Round #8 infusion occurred on 03/30/2023   > Round #9 infusion occurred on 04/20/23   > Round #10 infusion occurred on 05/11/2023   > Round #11 infusion occurred on 06/08/2023   > Round #12 infusion occurred on 07/02/2023   > Round #13 infusion scheduled for 07/27/2023     04/01/2023 Dr. Melvin Staff Office Visit   > Started IVIG infusion on 03/30/2023   > Dr. Melvin Staff Comments       - Dr. Melvin Staff always thought that IVIG isn't helping (relying on [facility] for treatment)       - Don't think (uncertain) I have CIDP; go 2 more rounds and reassess          - If IVIG is working, would get better quickly (and I am not)       - I have a progressive decline in function which seems to be on a accelerating rate of decline       - Would recommend more aggressive treatments if know what I have; BUT  don't know what I have       - Department of Neurology at [facility] is the best in the country       - Dr. Melvin Staff is referring me to Department of Neurology in [facility] Neurologist may have some ideas?     04/12/2023 Dr. Emelie Hanger Office Visit   > Discussed results of [facility] Bloodwork On 04/07/2023       - Most labs look good       - Trends to monitor for Annual Physical on 07/20/2023          - Creatine Kinase (last 3 results) -- 281, 177, 326             - Measures muscle breakdown, which is slightly up          - ESR/SED Rate (last 3 results) -- 116, 22, 43       - Dr. Emelie Hanger -- Perhaps the slight elevation may correlate with being off IVIG for 4 months   > GEW Comments       - I am really starting to get concerned about my future health!       - My walking has been drastically reduced -- both frequency, time and distance          - Feel like I am walking in swim flippers!       - Can no longer walk a golf course! In fact,  have difficulty completing round in a cart       - Balance is significantly worse       - Even though my walking pace is really slow, I really sweat a lot          - GEW Question: Does this profuse sweating mean anything?             - I forgot to discuss this!   > GEW Question: Any comments about restarting IVIG?       - Maybe the slight increase in Creatine Kinase & SED Rate due to stopping IVIG for 4 months   > GEW Question: Any further tests or diagnoses you can think of?       - Dr. Emelie Hanger -- Do Not Do A Clinical Trial; Experimental; Don't Know How Body Will React          - Follow what Neurologists suggest   > Dr. Emelie Hanger Comments       - What I have may not be fixable       - If assume due to COVID Vaccine -- the antibodies in the vaccine may have gone awry       - Probably dealing with some form of rare inflammation attacking antibodies          - Antibodies are attacking you       - The [facility]  [place] Antibody Panel may show what antibody is the problem          - Let's be hopeful that something shows up       - Ask Dr. Melvin Staff about taking a steroid -- Prednisone          - Surprised Neurologists have not suggested this       - For rash on thigh, possibly take Lotrimin Cream     05/25/2023 Dr. Melvin Staff Office Visit   > Dr. Melvin Staff performed some Functional Tests -- Compared to [facility] testing  on 03/12/2023       - 5 Sit-To-Stand time improved from 12.7 seconds to 10.79 seconds       - 30-Foot-Go Test improved from 8.09 seconds to 5.74 seconds       - Also performed some strength tests on legs and feet   > GEW Question; Should We Change IVIG Infusion Frequency From 3 Weeks To 2 Weeks?       - Dr. Melvin Staff -- Stay with 3 week intervals for 3 more times and reassess   > Discussed Itchiness Issue       - Itchiness came back after Round #9 for 3 days (November 12-14)       - Most of the itchiness has subsided now       - Applying Triamcinolone Acetonide Cream "As Needed"       - Taking Claritin "As  Needed"   > Discussed Rashes Issue       - Still have big rash on left thigh       - Applying Ciclopirox Olamine Cream "Daily" to affected areas   > Discussed [facility] Recommendation for the [facility]  [place] Demyelinating Panel       - Results came back as NORMAL!   > Dr. Melvin Staff Comments / Next Steps       - Dr. Melvin Staff wants to see me after 3 more rounds of IVIG infusions          - December 3          - December 27          - January 21       - Dr. Melvin Staff opines that I have a weird form of Neuropathy that may take years to figure out       - Dr. Melvin Staff is comfortable with not knowing what I have at this point in time and not alarmed that          don't know what is going on     06/12/2023 Stepped in rut in lawn and have minor fracture to fibula in my left leg   > Exercises and primarily walking have been scaled back since fracture   > Per Orthopedic Doctor (on 06/22/2023)       - Minor fracture       - Will heal on its own in 4-6 weeks       - No need for surgery or to wear a cast/brace       - OK to play golf and walk on it in moderation    PMH:   No past medical history on file.    PSH:   No past surgical history on file.    ALLERGIES:   Not on File    MEDICATIONS:  Current Outpatient Medications    Not on File       LABS:         IMAGING:      I have reviewed the attached imaging and I have found the following findings:    Name of imaging  Results/Key imaging features    EMG/NCS - 07/2021  EMG/NCS (08/01/21, [facility] [place])  "Normal" per EMG impression. Review of data shows absent sensory nerves in the legs and reduced amplitudes and slowing of conduction velocities in the motor nerves of the legs.    MRI Lumbar spine - [facility]    FINDINGS:      Five lumbar vertebral segments are designated.  Lumbar lordosis is present. The vertebral bodies are normal in height. The vertebrae are normal in alignment.     Age-appropriate fatty marrow changes are observed.     The conus medullaris is  normal in morphology and terminates at L1. No abnormal signal or enhancement within the visualized cord and the cauda equina. Small perineural cyst is seen associated with right S3 nerve.     Some loss of intervertebral disc height and desiccation is present throughout the lumbar levels. Irregular, non-uniform, peripheral enhancement in all intervertebral discs, worse in the lower levels, represents disc degenerative changes.     Individual disc levels:     T12-L1: No significant disc bulge/herniation. Patent central canal and neural foramina.     L1-L2: Mild diffuse circumferential disc bulge. Mild narrowing of bilateral neural foramen. Patent central canal.     L2-L3: Mild diffuse circumferential disc bulge and marginal osteophytes. Mild narrowing of bilateral neural foramen. Patent central canal.     L3-L4: Mild diffuse circumferential disc bulge and prominent marginal osteophytes. Mild facet arthropathy. Patent central canal. Mild narrowing of bilateral neural foramen.     L4-L5: Posterior annular fissure. Mild diffuse circumferential disc bulge with superimposed small posterior disc protrusion that effaces the ventral thecal sac. Mild narrowing the central canal. Mild to moderate narrowing of bilateral neural foramen.     L5-S1:  No disc herniation or spinal canal stenosis. Mild right neural foraminal narrowing.              SOCIAL HISTORY: Jonathon Lawrence      FAMILY HISTORY: Jonathon Lawrence family history is not on file.     REVIEW OF SYSTEMS:  Heart problems:  no complaints  Respiratory problems:  no complaints  General problems:  no complaints  ENT problems:  no complaints  Gastrointestinal problems:  no complaints  Urinary problems:  no complaints  Skin problems:  no complaints  Musculoskeletal problems:  no complaints  Neurologic problems:  bilateral LE weakness/ numbness  feeling like calves are heavy.   Psychiatric problems:  no complaints  Endocrine problems:  no complaints  Hematologic problems:  no  complaints  Allergic / Immunologic problems:  no complaints    OBJECTIVE   Vital Signs - There were no vitals taken for this visit.    This was telemed appt. Limited neurological exam   IMPRESSION AND RECOMMENDATIONS     IMPRESSION: Jonathon Lawrence is a 73 years old with possible CIDP, vs Idiopathic neuropathy vs. Sensorymotor neuropathy of unknown etiology. This was telemed appt, with limited neurological exam. I believe if has noted benefit from IVIG to c/w on the medication, since its takes 6 months to show efficacy. If its truly CIDP, then Vyvgart would be beneficial. I would recommend close f/u with Dr. Melvin Staff and [facility]. I do not have any further testing other than symptomatic treatmnet and increasing PT therapy. If CIDP diagnosis is confirmed, then alternative Rx would be vyvgart.       PLAN/ RECOMMENDATIONS:   1. Mixed sensory-motor polyneuropathy  Recommend close f/u     2. Idiopathic neuropathy  Would recommend c/w Rx on IVIG          Is a controlled substance Schedule II through IV being prescribed:  yes/no and I have reviewed this patient's prescription medication history via the [place]  Prescription Drug Monitoring Program's query site on    Jonathon Lawrence  does not have inconsistencies with regards to controlled substance prescriptions. Patient was given the  opportunity to ask questions and all questions of the patient were answered. Patient was counseled about,  potential diagnostic possibilities, therapeutic options, treatment plan, prognosis, and coordination of care as described above.      If applies add billing [id number]: The above documented care represents medical care services that are part of ongoing care related to this patient's serious or complex condition.        Rhina Center,  M.D.  Neurology [facility])

## 2023-11-03 ENCOUNTER — Encounter: Payer: Self-pay | Admitting: Gastroenterology

## 2023-11-19 ENCOUNTER — Encounter: Payer: Self-pay | Admitting: Oncology

## 2023-11-19 DIAGNOSIS — G6189 Other inflammatory polyneuropathies: Secondary | ICD-10-CM

## 2023-11-22 NOTE — Telephone Encounter (Signed)
 Left message for pt

## 2023-11-24 DIAGNOSIS — G6189 Other inflammatory polyneuropathies: Secondary | ICD-10-CM | POA: Insufficient documentation

## 2023-11-24 NOTE — Telephone Encounter (Signed)
 Patient has Medicare A&B, AARP    Due to Medicare guidelines, the diagnosis currently associated with the treatment plan of G62.9, does not meet criteria. Below are the current Medicare approved diagnoses that are similar codes:    G61.82 Multifocal motor neuropathy  G61.89 Other inflammatory polyneuropathies  G62.0 Drug-induced polyneuropathy  G62.81 Critical illness polyneuropathy  G62.89 Other specified polyneuropathies  G64 Other disorders of peripheral nervous system    If any of these apply, please update the orders, as well as the problem list and the documentation in the chart. If not, please provide a brief chart documentation indicating 2 journal articles or abstracts supporting the use of G62.9 for the preferred diagnosis, to use in the event that we need to submit additional information to Medicare for an appeal.The patient will need to sign an ABN as well.      Please let me know how to proceed   Thank you, jenn

## 2023-11-24 NOTE — Telephone Encounter (Signed)
Okay - thank you for letting me know.  Jonathon Lawrence

## 2023-11-24 NOTE — Telephone Encounter (Signed)
 Auth info updated for IVIG      11/24/2023   [facility] PRIOR AUTH    Prior Authorization Needed No    Treatment/Medication Name IVIG  1g/kg every 3 weeks    Authorization Type Medication    CPT Code J1459 [employer]    Diagnosis Code G61.89    Physician Name and NPI Woodford Hayward    [id number]    Service Location Other    Prior Authorization Status Authorization not required    Authorization Number No PA required W/Dx G61.89 per LCD Medicare A&B // [name] will follow [employer] Name Medicare A&B, AARP    [employer]     Policy Number [id number], [id number]    Comments Dx does not meet LCD Medicare criteria w/Dx G62.9 - sent message to clinic asking for instructions on how to proceed // Dx changed to G61.89 > payable per LCD Medicare

## 2023-12-07 ENCOUNTER — Other Ambulatory Visit: Payer: Self-pay

## 2023-12-07 ENCOUNTER — Telehealth: Payer: Self-pay

## 2023-12-07 ENCOUNTER — Other Ambulatory Visit: Payer: Self-pay | Admitting: Neurology

## 2023-12-07 NOTE — Telephone Encounter (Signed)
 Jonathon Lawrence is still interested and comfortable coming in for treatment.  I completed the COVID-19 screen, patient confirmed that they have not had any exposure and does not have any symptoms.  I explained that we are taking extra precautions and using some social distancing practices and patient knows to call if they start to develop symptoms or learns that they have come in contact with a confirmed case.    Do you feel like you could have an infection or are you taking antibiotics for an infection? no    Confirmed with patient that they have had no insurance changes     11/24/2023   [facility] PRIOR AUTH    Prior Authorization Needed No    Treatment/Medication Name IVIG  1g/kg every 3 weeks    Authorization Type Medication    CPT Code J1459 [employer]    Diagnosis Code G61.89    Physician Name and NPI Woodford Hayward    [id number]    Service Location [facility]    Prior Authorization Status Authorization not required    Authorization Number No PA required W/Dx G61.89 per LCD Medicare A&B // [name] will follow [employer] Name Medicare A&B, AARP    [employer]  //  11/30/23 Changed back to [facility] Buy/Bill    Policy Number [id number], [id number]    Comments Dx does not meet LCD Medicare criteria w/Dx G62.9 - sent message to clinic asking for instructions on how to proceed // Dx changed to G61.89 > payable per LCD Medicare

## 2023-12-08 ENCOUNTER — Ambulatory Visit: Payer: Medicare Other | Attending: Neurology | Admitting: Oncology

## 2023-12-08 ENCOUNTER — Encounter: Payer: Self-pay | Admitting: Oncology

## 2023-12-08 ENCOUNTER — Telehealth: Payer: Self-pay

## 2023-12-08 ENCOUNTER — Ambulatory Visit

## 2023-12-08 ENCOUNTER — Other Ambulatory Visit: Payer: Self-pay | Admitting: Neurology

## 2023-12-08 VITALS — BP 123/75 | HR 72 | Wt 206.0 lb

## 2023-12-08 VITALS — BP 135/75 | HR 65 | Temp 98.6°F | Resp 18 | Wt 205.0 lb

## 2023-12-08 DIAGNOSIS — G6189 Other inflammatory polyneuropathies: Secondary | ICD-10-CM | POA: Insufficient documentation

## 2023-12-08 MED ORDER — IMMUNE GLOBULIN (HUMAN) 10% 5 GM/50ML IV SOLN (PRIVIGEN) *I*
1.0000 g/kg | Freq: Once | INTRAVENOUS | Status: AC
Start: 2023-12-08 — End: 2023-12-08
  Administered 2023-12-08: 95 g via INTRAVENOUS
  Filled 2023-12-08: qty 950

## 2023-12-08 MED ORDER — MEDS EXIST IN SUPPORTIVE PLAN *I*
Status: AC
Start: 2023-12-08 — End: ?

## 2023-12-08 NOTE — Telephone Encounter (Signed)
 Left vm to schedule FUV w/ [place]/ Heather last Wednesday in August

## 2023-12-08 NOTE — Patient Instructions (Signed)
You received IVIG (IntraVenous Immune Globulin) today. Many people  do not have side effects from IVIG but some do. Some side effects are  noticed right a way and others can develop over time.  The most common side effects are:  Headache  Stiff neck  Fever  Chills  Flushing  Flu like muscle pains  Joint pain  Feeling tired  Upset stomach  Vomiting  Diarrhea  For the most part ,these reactions are mild and typically happen with  the first few doses. If you experience any of these symptoms let your  nurse know when you come for your next infusion. These symptoms can  be managed by either slowing down how fast the IVIG is given or giving  medications such as Tylenol,Benadryl or a small amount of steroid  before the IVIG is started.  If you experience any of the above side effects while at home you can  take tylenol as prescribed. Drinking extra water can also help. If  symptoms are not relieved after 48 hours or headache is severe please  call the clinic at [phone].      Please reach out to the clinic with any additional questions or concerns. We encourage you to sign up and use MyChart for any non-urgent medical questions as MyChart is the preferred method of communication.  Please contact your pharmacy for medication refills and allow up to 2 business days for routine prescription refills.  For urgent messages, please call the clinic at [phone].  The clinic is open from 8am to 4:30pm Monday through Friday.    You can get [facility] appointment reminders by text - please make sure we have your cell phone number on file at check in/check-out.  Then you can just text [facility] to [phone] to receive appointment messages.

## 2023-12-08 NOTE — Progress Notes (Signed)
 Neuromuscular Follow Up Patient Visit Note    Dear Dr. Melvin Staff,    We had the pleasure of seeing your patient, Jonathon Lawrence, in the Neuromuscular clinic today.  He is a 73 y.o. male being seen for follow up of neuropathy. He was last seen 03/12/2023    Interval history:  He does not like walking as much anymore. He feels tired when he is on his feet for a long period of time. Last year this time, he was able to walk 18 holes. When he stopped the IVIG last year by the end of last Summer he noticed a more significant decline in his function. Since restarting IVIG he has noticed more slow worsening but mostly stable compared to the last time he came.     He works out in the threadmill, after 15 minutes his feet start slapping on the belt. He has done PT in the last 2 months. He feels fairly weak in golf. Celeste Cola does not go as far anymore.     Balance is not good. If he has to walk a narrow space has difficulty.     Does not have any pain.    He has been on 1 g/kg every 3 weeks. Since being back on the IVIG things seem stable    He is wondering if there are any other options. He has been reading about [facility]. He discussed it with the prior neurologist.     Treatment history/failed:   - IVIG started in November 2023.  He was receiving IVIG 35 g every day x 4 days every 4 weeks (which is approximately 2 g/kg every 4 weeks).  IVIG discontinued May 2024 given no robust improvement with IVIG supplementation.  Of note, had brief period of skin itchiness that resolved with topical steroids, cetirizine, and duloxetine. IVIG restarted Fall of 2024 due to significant worsening while off. On 1 g/kg every 3 weeks.    Allergies as of 12/08/2023    (No Known Allergies (drug, envir, food or latex))      Medications:   Current Outpatient Medications   Medication Sig Dispense Refill    aspirin 81 mg EC tablet Take 1 tablet (81 mg total) by mouth daily.      latanoprostene bunod (VYZULTA) 0.024 % ophthalmic solution 1 drop into affected eye  in the evening      levothyroxine (SYNTHROID, LEVOTHROID) 75 mcg tablet 1 tablet in the morning on an empty stomach      Multiple Vitamins-Minerals (PRESERVATION AREDS) TABS tablet as directed       No current facility-administered medications for this visit.        Physical Examination:   BP 123/75   Pulse 72   Wt 93.4 kg (206 lb)   SpO2 97%   BMI 27.94 kg/m       General: Pleasant and sitting comfortably  CV:  warm and well perfused  Pulm: Normal work of breathing  Extremities: high arches and hammertoes    MS: Alert and interactive. Language appears intact.  CN: Ocular versions full and conjugate. Pursuits are smooth. Facial sensation intact to light touch.  Able to raise eye brows symmetrically. Able to bury lashes on eye closure with symmetrical resistance to opening. No ptosis. Able to puff up cheeks. Smile symmetrical with equal activation. Hearing grossly intact. Palate elevates symmetrically. Shoulder shrug strong bilaterally. Tongue strong with lateral protrusion.  No fasciculations in tongue. No atrophy of the tongue. No dysarthria.    Motor:  Tone:  Increase in tone in bilateral upper extremities, right more than left. Worsens with distracting maneuvers.  Muscle bulk: Atrophy in bilateral hands.  Involuntary movements: Postural tremor with arms extended, no rest tremor. Fasciculations observed in bilateral thighs.  Muscle Right Left   Neck flexor -    Upper extremity     Shoulder abductor 5 5   Shoulder forward flexor 5 5   Elbow flexors 5 5   Elbow extensors 5 5   Wrist flexors 5 5   Wrist extensors 5 5   Distal finger flexors  5 5   Finger extensors +4 +4   Finger abductors 4 4   Thumb flexor (FPL) 4 4   Thumb abductor 4 4   Lower extremity     Hip flexor 5 5   Hip abductor - -   Knee flexor 5 5   Knee extensor 5 5   Ankle dorsiflexor 4 4   Ankle plantarflexor 4 4   Ankle inversion 5 5   Ankle eversion 5 5   Toe extensor 3 3   Toe flexor - -     He has difficulty walking on toes and heels. Uses his  arms to support to stand up.      Reflex Right Left   BR 2+ 2+   Biceps 2+ 2+   Triceps 2+ 2+   Patellae 0 0   Ankles 0 0       Sensory:  Pin Prick: mid foot and fingertips bilaterally  Light touch: Intact and symmetric in upper and lower extremities.  Vibration (R/L, in seconds)   Great toe: 0/0   Medial malleolus: 7/7  Position Sense: Absent at the great toes  Romberg's sign: Negative    Coordination:  Finger-Nose-Finger: Intact    Gait: Wide based gait. Unable to tandem gait.    Functional Testing:     5 STS:  03/12/2023: 12.70 seconds    30 Foot Go:   03/12/2023: 8.09    Grip Strength (using electric dynamometer in Kg)   03/12/2023: R-25.4, 24.1, 28.1= avg 25kg L- 21.1, 22.9, 21.9= avg 21kg  12/08/23: R- 25.3, 26.6, 24.1= avg 25.3 kg  L-19.7, 23.2, 20.7= avg 21.2 kg    RODS for GBS - CIDP - MGUSP    Are you able to:  Read a newspaper/book?: 2 - Possible, without any difficulty  Eat?: 2 - Possible, without any difficulty  Brush your teeth?: 2 - Possible, without any difficulty  Wash upper body?: 2 - Possible, without any difficulty  Sit on a toilet?: 2 - Possible, without any difficulty  Make a sandwich?: 2 - Possible, without any difficulty  Dress upper body?: 2 - Possible, without any difficulty  Wash lower body?: 2 - Possible, without any difficulty  Move a chair?: 2 - Possible, without any difficulty  Turn a key in a lock?: 2 - Possible, without any difficulty  Go to general practitioner?: 2 - Possible, without any difficulty  Take a shower?: 2 - Possible, without any difficulty  Do the dishes?: 2 - Possible, without any difficulty  Do the shoppig?: 2 - Possible, without any difficulty  Catch an object (e.g., ball)?: 2 - Possible, without any difficulty  Bend and pick up an object: 2 - Possible, without any difficulty  Walk one flight of stairs?: 1 - Possible, but with some difficulty  Travel by public transportation?: 2 - Possible, without any difficulty  Walk and avoid obstacles?: 2 - Possible, without any  difficulty  Walk outdoor < 1 km?: 1 - Possible, but with some difficulty  Carry and put down a heavy object?: 1 - Possible, but with some difficulty  Dance?: 1 - Possible, but with some difficulty  Stand for hours?: 0 - Not possible to perform  Run?: 0 - Not possible to perform  Total score: 40     RODS:   03/12/2023: 43/60  12/08/2023: 40      Pertinent Results   Labs Reviewed:  Negative AChR abs 08/13/21    CK 398    CSF (04/15/22, per patient message): Prot 53  CSF (10/28/22): WBC 4, RBC 3, gluc 62, prot 54, culture neg    [facility] Comprehensive Neuropathies Panel (03/18/2022): Negative    Lab Results   Component Value Date    VB12 444 03/04/2022    MMAS 0.16 03/04/2022    FOL >14.0 03/04/2022    VITB1 159 03/04/2022    VITB6 43.5 03/04/2022    HA1C 5.7 (H) 03/04/2022    CUS 94.4 03/04/2022    INTPE Normal Pattern 03/04/2022    SIF1 Normal Pattern 03/04/2022    ANAS NEG 03/04/2022    TSH 3.26 03/04/2022    FT4 1.3 03/04/2022    ZINP 64.8 03/04/2022    ESR 12 03/04/2022    CRP <3 03/04/2022     Imaging Reviewed:   MRI L spine w/o contrast 08/08/21 by report: mild disc bulge L4-5    MRI T spine w/o contrast 08/10/21 by report: cord normal; thoracic degenerative changes    MRI L-spine w/wout (03/16/22):   Multilevel disc degenerative changes with individual levels as described above.  No abnormal enhancement within the cauda equina.  SOFT TISSUES: Marked atrophy of multiple visualized right-sided muscles including the psoas (more pronounced in the pelvis), iliacus muscle as well as the piriformis muscle. Mild near symmetrical atrophic changes in the gluteal muscles    MRI Brain without contrast 09/30/22:   Central volume loss versus underlying normal pressure hydrocephalus.    MRI C-spine w/out contrast 09/30/22:   No evidence of intramedullary signal abnormality. No significant cervical canal stenosis. There is right greater than left foraminal stenosis at C4-5 and C5-6, not significantly different from Jan 2023 MRI. Modic type 1  change at C4-5 and C5-6, present previously. Straightened upper cervical lordosis is unchanged. There is mild cervical dextrocurvature scoliosis.     Procedures Reviewed:  EMG/NCS (08/01/21, [facility])  "Normal" per EMG impression. Review of data shows absent sensory nerves in the legs and reduced amplitudes and slowing of conduction velocities in the motor nerves of the legs.    EMG/NCS 03/04/22:   This is an abnormal study.      The right median and ulnar motor responses are normal. The right tibial motor response has a low amplitude with a prolonged latency and mild conduction velocity slowing. The right fibular motor response recording at the EDB has a low amplitude and a normal conduction velocity. The fibular motor response recording at the TA has a borderline low amplitude with normal conduction velocity.       The right median F-wave responses are mildly prolonged.      The right median, ulnar, and radial sensory responses have low amplitudes. The right sural sensory response is normal for age.       Neuromuscular ultrasound of the median nerve throughout the right arm shows a normal cross sectional area. The right brachial plexus also shows a normal cross sectional area.  Needle EMG examination of the right upper and lower extremity shows length-dependent reinnervation with uncompensated denervation in distal leg muscles.  All limb muscles tested are affected including deltoid in the arm and iliopsoas in the leg.  The upper trapezius and mid-thoracic paraspinals are spared.        Overall, there is electrodiagnostic evidence of a moderate, predominantly axonal, sensorimotor polyneuropathy (versus a poly-radiculo-neuropathy) with length-dependent motor axon loss, and non-length-dependent sensory axon loss.     Impression:     1) Sensorimotor polyneuropathy    Martrell Eguia is a 73 y.o.  with a sensorimotor polyneuropathy with slow progression since 2021. Overall, cause is unclear despite extensive  diagnostic evaluation. There was consideration of CIDP given temporal relationship to vaccination and mild CSF protein elevation though he has received. Initially, he did not seem to obtain benefit from IVIG but he worsened noticebly after it was stopped. He has had relative stability in his symptoms compared to the decline he experienced while off IVIG, though there has been some mild decline over the past year. At this point, we could consider increasing the IVIG to 1 g/kg every 2 weeks to see if he can obtain further benefits. He would prefer to maintain current regimen until he gets back to [place] , when it will be easier to coordinate travel to the infusion center. We will recommend that he follows up with us  before he goes to [place]  to evaluate for signs of further decline. We may consider repeating EMG/NCS at that point to see if there have been any changes to point to a potential etiology of his neuropathy. Additional testing we could consider aside from this is a nerve biopsy.    Recommendations:     Can consider increasing IVIG to 1 g/kg every 2 weeks, will consider switching to this regimen when he goes back to [place]   Encouraged to record functional testing monthly while on IVIG to re-evaluate and assess for improvement  After completion of note, will send records to Dr. Monico Anna neurology office in [place] .  Advised to follow-up in September before going to [place]     Thank you for allowing us  to participate in the care of your patient.  Please call us  with any questions.    Clois Treanor Jimmy Moulding, MD, Neurology PGY4  12/08/23

## 2023-12-08 NOTE — Patient Instructions (Signed)
 Please reach out to the clinic with any additional questions or concerns. We encourage you to sign up and use MyChart for any non-urgent medical questions as MyChart is the preferred method of communication.  Please contact your pharmacy for medication refills and allow up to 2 business days for routine prescription refills.  For urgent messages, please call the clinic at [phone].  The clinic is open from 8am to 4:30pm Monday through Friday.    You can get [facility] appointment reminders by text - please make sure we have your cell phone number on file at check in/check-out.  Then you can just text [facility] to [phone] to receive appointment messages.

## 2023-12-08 NOTE — Progress Notes (Signed)
 Patient presents for an infusion of IVIG-Privigen 95 gms. Patient was receiving IVIG at infusion center in [facility] but is new to [facility]. Patient reported taking Tylenol and Benadryl prior to arrival. Infusion started @ 1218 and stopped @ 1647. Pt tolerated infusion well to maximum rate of 280 cc/hr with no adverse reactions reported or observed. All questions and concerns addressed.

## 2023-12-13 ENCOUNTER — Encounter: Payer: Self-pay | Admitting: Oncology

## 2023-12-13 ENCOUNTER — Other Ambulatory Visit: Payer: Self-pay

## 2023-12-13 ENCOUNTER — Other Ambulatory Visit: Payer: Self-pay | Admitting: Family

## 2023-12-13 NOTE — Telephone Encounter (Signed)
 Reading below the billing should Not be going to his [facility] Medicare part D coverage - that is pharmacy coverage. We checked it through medical A&B per Buy/Bill infusion.    I checked again today through CMS site with >  G61.89 Other inflammatory polyneuropathies is a covered and payable Dx per [employer].     Also - in the past we have ran into issues with the Dx code we confirm is payable then when orders are written a different Dx code is attached and results in a denial.     Per PA stand point - this is payable per medical > needs to be investigated how it was billed? I don't believe this is a Prior auth issue but please let me know if anything is needed from me.     Thank you, jenn

## 2023-12-13 NOTE — Progress Notes (Unsigned)
 Need order for ivig NEEDS TO BE UNDER DX G61.89

## 2023-12-13 NOTE — Progress Notes (Signed)
 IVIG supportive plan signed for 6/23. Renal panel order x1

## 2023-12-14 ENCOUNTER — Other Ambulatory Visit: Payer: Self-pay | Admitting: Family

## 2023-12-14 ENCOUNTER — Encounter: Payer: Self-pay | Admitting: Neurology

## 2023-12-14 NOTE — Progress Notes (Signed)
 A user error has taken place: encounter opened in error, closed for administrative reasons.

## 2023-12-14 NOTE — Telephone Encounter (Signed)
 Spoke to [place] he is all set denial was from home infusions

## 2023-12-24 MED FILL — Immune Globulin (Human) IV Soln 5 GM/50ML: INTRAVENOUS | Qty: 950 | Status: AC

## 2023-12-26 ENCOUNTER — Other Ambulatory Visit: Payer: Self-pay

## 2023-12-27 ENCOUNTER — Ambulatory Visit: Attending: Family

## 2023-12-27 VITALS — BP 135/74 | HR 58 | Temp 97.4°F | Resp 17

## 2023-12-27 DIAGNOSIS — G6189 Other inflammatory polyneuropathies: Secondary | ICD-10-CM | POA: Insufficient documentation

## 2023-12-27 MED ORDER — IMMUNE GLOBULIN (HUMAN) 10% 5 GM/50ML IV SOLN (PRIVIGEN) *I*
1.0000 g/kg | Freq: Once | INTRAVENOUS | Status: AC
Start: 2023-12-27 — End: 2023-12-27
  Administered 2023-12-27: 95 g via INTRAVENOUS
  Filled 2023-12-27: qty 50

## 2023-12-27 NOTE — Progress Notes (Signed)
 Patient presents for an infusion of IVIG-Privigen  95gms. Patient takes pre-medications at home.  Infusion started @ 931a and stopped @  2p   . Max rate 279cc/hour.  Pt tolerated infusion well, no adverse reactions reported or observed.  Patient discharged to home, stable with after visit summary given and explained.

## 2023-12-28 ENCOUNTER — Ambulatory Visit

## 2024-01-03 ENCOUNTER — Other Ambulatory Visit: Payer: Self-pay

## 2024-01-03 NOTE — Progress Notes (Unsigned)
 Need order for privigen 

## 2024-01-04 NOTE — Progress Notes (Signed)
IVIG orders done.

## 2024-01-16 ENCOUNTER — Other Ambulatory Visit: Payer: Self-pay

## 2024-01-17 ENCOUNTER — Ambulatory Visit: Attending: Neurology

## 2024-01-17 VITALS — BP 131/69 | HR 49 | Temp 95.9°F | Resp 18 | Wt 207.0 lb

## 2024-01-17 DIAGNOSIS — G6189 Other inflammatory polyneuropathies: Secondary | ICD-10-CM | POA: Insufficient documentation

## 2024-01-17 MED ORDER — IMMUNE GLOBULIN (HUMAN) 10% 5 GM/50ML IV SOLN (PRIVIGEN) *I*
1.0000 g/kg | Freq: Once | INTRAVENOUS | Status: AC
Start: 2024-01-17 — End: 2024-01-17
  Administered 2024-01-17: 95 g via INTRAVENOUS
  Filled 2024-01-17: qty 950

## 2024-01-17 NOTE — Patient Instructions (Signed)
You received IVIG (IntraVenous Immune Globulin) today. Many people  do not have side effects from IVIG but some do. Some side effects are  noticed right a way and others can develop over time.  The most common side effects are:  Headache  Stiff neck  Fever  Chills  Flushing  Flu like muscle pains  Joint pain  Feeling tired  Upset stomach  Vomiting  Diarrhea  For the most part ,these reactions are mild and typically happen with  the first few doses. If you experience any of these symptoms let your  nurse know when you come for your next infusion. These symptoms can  be managed by either slowing down how fast the IVIG is given or giving  medications such as Tylenol,Benadryl or a small amount of steroid  before the IVIG is started.  If you experience any of the above side effects while at home you can  take tylenol as prescribed. Drinking extra water can also help. If  symptoms are not relieved after 48 hours or headache is severe please  call the clinic at [phone].      Please reach out to the clinic with any additional questions or concerns. We encourage you to sign up and use MyChart for any non-urgent medical questions as MyChart is the preferred method of communication.  Please contact your pharmacy for medication refills and allow up to 2 business days for routine prescription refills.  For urgent messages, please call the clinic at [phone].  The clinic is open from 8am to 4:30pm Monday through Friday.    You can get [facility] appointment reminders by text - please make sure we have your cell phone number on file at check in/check-out.  Then you can just text [facility] to [phone] to receive appointment messages.

## 2024-01-17 NOTE — Progress Notes (Signed)
 Patient presents for an infusion of IVIG-Privigen  95 gms. Patient took home Tylenol and Benadryl prior to arrival, so their ordered pre-medications were held. Infusion started @ 1031 and stopped @ 1457. Pt tolerated infusion well to maximum rate of 282 cc/hr with no adverse reactions reported or observed.

## 2024-01-18 ENCOUNTER — Ambulatory Visit

## 2024-01-18 ENCOUNTER — Encounter: Payer: Self-pay | Admitting: Oncology

## 2024-01-24 ENCOUNTER — Other Ambulatory Visit: Payer: Self-pay | Admitting: Family

## 2024-01-24 DIAGNOSIS — G629 Polyneuropathy, unspecified: Secondary | ICD-10-CM

## 2024-01-25 ENCOUNTER — Telehealth: Payer: Self-pay

## 2024-01-25 NOTE — Telephone Encounter (Signed)
 Called patient to schedule EMG/BUE BLE Sensorimotor neuropathy

## 2024-02-04 ENCOUNTER — Other Ambulatory Visit: Payer: Self-pay | Admitting: Neurology

## 2024-02-04 MED FILL — Immune Globulin (Human) IV Soln 5 GM/50ML: INTRAVENOUS | Qty: 950 | Status: AC

## 2024-02-04 NOTE — Progress Notes (Signed)
 IVIG orders signed per request.

## 2024-02-06 ENCOUNTER — Other Ambulatory Visit: Payer: Self-pay

## 2024-02-07 ENCOUNTER — Ambulatory Visit: Attending: Neurology

## 2024-02-07 VITALS — BP 137/69 | HR 51 | Temp 97.8°F | Resp 16 | Wt 210.0 lb

## 2024-02-07 DIAGNOSIS — G6189 Other inflammatory polyneuropathies: Secondary | ICD-10-CM | POA: Insufficient documentation

## 2024-02-07 MED ORDER — IMMUNE GLOBULIN (HUMAN) 10% 5 GM/50ML IV SOLN (PRIVIGEN) *I*
1.0000 g/kg | Freq: Once | INTRAVENOUS | Status: AC
Start: 2024-02-07 — End: 2024-02-07
  Administered 2024-02-07: 95 g via INTRAVENOUS
  Filled 2024-02-07: qty 800

## 2024-02-07 NOTE — Patient Instructions (Signed)
Please reach out to the clinic with any additional questions or concerns. We encourage you to sign up and use MyChart for any non-urgent medical questions as MyChart is the preferred method of communication.  Please contact your pharmacy for medication refills and allow up to 2 business days for routine prescription refills.  For urgent messages, please call the clinic at [phone].  The clinic is open from 8am to 4:30pm Monday through Friday.    You can get [facility] appointment reminders by text - please make sure we have your cell phone number on file at check in/check-out.  Then you can just text [facility] to [phone] to receive appointment messages.               Immunoglobulin Therapy    Why is this procedure done?   Immunoglobulins are found in plasma, which makes up part of the blood. They are also called antibodies. These antibodies help fight germs. This procedure is done to treat problems of the immune system, such as:  Autoimmune disease ? When the body starts to attack its own cells  Immunodeficiency ? A weak immune system  Some types of inflammatory disease  Some types of leukemia  This treatment may be called IVIG, which stands for intravenous immunoglobulin.  What will the results be?   This should help your immune system fight germs better so you will be healthier. This should help to lessen the side effects of your illness.  What happens before the procedure?   You may not have to do anything special before getting IVIG. Sometimes, you will have blood tests to see how well your kidneys are working. You may be given drugs to lower the chance of a bad allergic reaction.  What happens during the procedure?   Antibodies are taken from the plasma donated by healthy people. They are added to a sterile fluid. You will have an I.V., most often in your arm. The mixture will flow into your vein through the I.V. This may take 2 to 8 hours, based on how you are feeling. You may have effects that cause the  procedure to take longer.  What happens after the procedure?   Routine I.V. fluids may be connected to the line to make sure all the IVIG has been given.  The I.V. is taken out and you may go home. You may feel a little pain where the I.V. was placed.  Your body uses the antibodies over the next 3 to 4 weeks. You may need routine doses or maintenance care after the first infusion.  You may need more blood tests to check how well your kidneys and immune system are working.  What care is needed at home?   Take all your drugs as ordered.  What follow-up care is needed?   Your doctor may ask you to make visits to the office to check on your progress. Be sure to keep these visits. Do not take any new vaccines or drugs without checking with your doctor.  What problems could happen?   Chills  Upset stomach or throwing up  Back or hip pain  Headache  Infection  Fluid in the lungs  Damage to the kidneys  Blood clots  Very bad allergic reaction to the IVIG  When do I need to call the doctor?   Signs of a very bad reaction. These include wheezing; chest tightness; fever; itching; bad cough; blue skin color; seizures; or swelling of face, lips, tongue, or throat.  Go to the ER right away.  Signs of infection. These include a fever of 100.39F (38C) or higher, chills, very bad sore throat, ear or sinus pain, cough, more sputum or change in color of sputum, pain with passing urine, mouth sores, or wound that will not heal.  Confusion  Slurred or abnormal speech  Fast heartbeat; weak or fast pulse  Dizziness  Anxiety  Upset stomach, throwing up, loose stools, or belly pain  Skin becoming reddish  Dark colored urine or less urine output  Where can I learn more?   [employer]  [employer]

## 2024-02-07 NOTE — Progress Notes (Signed)
 Patient presents for an infusion of IVIG- 95 gms in usual state of health. Infusion started @ 0925 and stopped @ 1344. Max rate 286 ml/hr. Pt tolerated infusion well, no adverse reactions reported or observed. Pt was discharged to home in stable condition. AVS and discharge instructions reviewed.

## 2024-02-08 ENCOUNTER — Ambulatory Visit

## 2024-02-17 ENCOUNTER — Encounter

## 2024-02-21 ENCOUNTER — Other Ambulatory Visit: Payer: Self-pay | Admitting: Family

## 2024-02-21 NOTE — Progress Notes (Signed)
 IVIG orders signed. Renal panel x 1 ordered.

## 2024-02-23 ENCOUNTER — Other Ambulatory Visit: Payer: Self-pay

## 2024-02-24 ENCOUNTER — Ambulatory Visit: Attending: Pediatrics

## 2024-02-24 DIAGNOSIS — G629 Polyneuropathy, unspecified: Secondary | ICD-10-CM | POA: Insufficient documentation

## 2024-02-24 DIAGNOSIS — G609 Hereditary and idiopathic neuropathy, unspecified: Secondary | ICD-10-CM | POA: Insufficient documentation

## 2024-02-24 NOTE — Procedures (Signed)
 Exam location: [address] Attending physician: Dr. Dena Sheriff Patient: Jonathon Lawrence, Jonathon Lawrence  Patient ID: [id number]  Date of Birth: 03-18-1951 Height: 6'0 Gender: Male Report ID: [id number] Exam Date: 02/24/2024 Referring Physician(s):  Powell Raman, NP / Dr. Manus Garland Provider(s):  Dr. Dena Sheriff / Vaughn Romberg Referring Diagnosis:  Sensorimotor Polyneuropathy, Axonal Final Diagnosis:  Sensorimotor Polyneuropathy, Axonal Patient History Jonathon Lawrence is a 73 year old male who is referred by Dr. Garland and Powell Raman, NP to evaluate interval progression of known neuropathy.   For a full history, please reference the clinical notes from Dr. Garland and Powell Romeiser. Jonathon Lawrence underwent a prior study in Aug 2023 and since has undergone extensive diagnostic evaluation without clear underlying cause. He has trialed IVIG with a notable progression when it was paused, but unfortunately has continued to note decline despite resuming IVIG. He continues to note worsening weakness particularly in his ankles and feet along with worsening balance.   A time out procedure was completed to verify the patient's identity including first and last name, date of birth and confirmed with id band. The correct procedure and site were reviewed with the patient.  Verbal consent was obtained prior to testing.  During performance of nerve conduction studies, distal limb temperature was maintained between 32 to 36 degrees Centigrade. Clinical Examination For a detailed neurologic examination, please see Dr. Mia neuromuscular clinic notes. Motor Nerve Conduction Nerve and Stimulus Site  Onset Latency  Amplitude  Conduction Velocity  Distance  Normal  Fibular.R/EDB  Ankle  5.3 ms  0.52 mV       90 mm  no  Fib head  15.1 ms  0.39 mV  41 m/s  400 mm  no  Pop fossa  16.8 ms  0.43 mV  46 m/s  80 mm  no  Fibular.R/TA  Fib head  3.4 ms  2.8 mV        90 mm  no  Pop fossa  5.4 ms  2.4 mV  42 m/s  80 mm  no  Median.R/APB  Wrist  4.3 ms  7 mV       70 mm  yes  Elbow  15.9 ms  2.7 mV  23 m/s  270 mm  no     Conduction block  Tibial.R/AHB  Ankle  5.8 ms  0.24 mV       90 mm  no  Pop fossa  14.6 ms  0.34 mV  43 m/s  380 mm  no  Ulnar.R/ADM  Wrist  2.7 ms  8.3 mV       70 mm  yes  Bel elbow  7.2 ms  7.4 mV  54 m/s  240 mm  yes  Ab elbow  9.6 ms  6.7 mV  41 m/s  100 mm  no  Sensory Nerve Conduction Nerve and Stimulus Site  Segment  Onset Latency  Amplitude  Conduction Velocity  Distance  Normal  Median (Dig II).R  Mid palm  Mid palm-Dig II  1.4 ms  7.1 uV  55 m/s  75 mm  no  Wrist  Wrist-Dig II  2.9 ms  3.5 uV  51 m/s  150 mm  no       Wrist-Mid palm            48 m/s  75 mm  no  Radial.R  Forearm  Forearm-Wrist  1.7 ms  9.8 uV  61 m/s  100 mm  no  Sural.R  Calf  Calf-Lat mall  absent  absent  absent  120 mm  no  Ulnar (Dig V).R  Wrist  Wrist-Dig V  absent  absent  absent  110 mm  no  Needle EMG Data Muscle S i d e Spontaneous Activity Motor Unit Morphology Interference Pattern   Insertional Activity Fibs/Pos. Waves Fascics Duration Amplitude Phases Activation Recruit ment Deltoid       Normal  0  0  1  Normal  Normal  Normal  -1  Tensor Fasciae Latae       Normal  0  0  1  1  Normal  Normal  -1  Vastus Medius       Normal  0  0  1  1  Normal  Normal  -1  Neuromuscular Ultrasound   Ultrasound Study of Peripheral Nerves          Nerve  Site  Side Cross-section Area  Normal  Median  distal wrist crease  Right  12.4 mm2  no     Stable from prior  Median  mid forearm  Right  8 mm2  yes  Median  pronator teres  Right  16.2 mm2  no  Median  antecubital fossa  Right  16 mm2  no  Median  mid arm  Right  7.2 mm2  yes  Median  axilla  Right  6.3 mm2  yes  Ulnar  cubital tunnel  Right  11.5 mm2  no  Ulnar  ulnar groove  Right  9.5 mm2  yes  Ulnar  mid arm  Right  5.4 mm2   yes        Interpretation & Conclusions This is an abnormal study.   The right median motor response is notable for prolonged conduction velocity between the antecubital fossa and the wrist, this is new from the prior study. The ulnar motor response demonstrates prolonged conduction velocity across the elbow, this is new from the prior study. The right tibial motor response has a low amplitude. The right fibular motor response recording at the EDB has a low amplitude and a normal conduction velocity. The fibular motor response recording at the TA has a low amplitude with normal conduction velocity.     The right median and radial sensory responses have low amplitudes. The right sural and ulnar sensory responses are absent, this is new from the prior study.    Neuromuscular ultrasound of the median nerve throughout the right arm shows an increased cross sectional area at the pronator teres and antecubital fossa. Neuromuscular ultrasound of the ulnar nerve demonstrates mild focal enlargement at the cubital tunnel.    Needle EMG examination of the right deltoid, tensor fascia lata, and vastus medius show evidence of chronic reinnervation without active denervation.    Overall, there continues to be evidence of a moderate, length dependent, predominantly axonal, sensorimotor polyneuropathy that has progressed since his prior study. Notably, there is now evidence of conduction block with focal nerve enlargement of the proximal median motor nerve.   As the teaching physician identified below, I was physically present for the key portions of the electrodiagnostic examination and I prepared the interpretation at the time of the examination.  Signature This report signed electronically by  Dr. Dena Sheriff on 02/24/2024 6:16 PM   Dr. Dena Sheriff / Vaughn Romberg

## 2024-02-29 ENCOUNTER — Telehealth: Payer: Self-pay

## 2024-02-29 ENCOUNTER — Other Ambulatory Visit: Payer: Self-pay

## 2024-02-29 ENCOUNTER — Encounter: Payer: Self-pay | Admitting: Neurology

## 2024-02-29 MED FILL — Immune Globulin (Human) IV Soln 5 GM/50ML: INTRAVENOUS | Qty: 950 | Status: AC

## 2024-02-29 NOTE — Progress Notes (Addendum)
 Neuromuscular Follow Up Patient Visit NoteDear Dr. Gillie Ide had the pleasure of seeing your patient, Jonathon Lawrence, in the Neuromuscular clinic today.  He is a 73 y.o. male being seen for follow up of neuropathy. He was last seen 9/6/2024Interval history:He does not like walking as much anymore. He feels tired when he is on his feet for a long period of time. Last year this time, he was able to walk 18 holes. When he stopped the IVIG last year by the end of last Summer he noticed a more significant decline in his function. Since restarting IVIG he has noticed more slow worsening but mostly stable compared to the last time he came. He works out in the threadmill, after 15 minutes his feet start slapping on the belt. He has done PT in the last 2 months. He feels fairly weak in golf. Jonathon Lawrence does not go as far anymore. Balance is not good. If he has to walk a narrow space has difficulty. Does not have any pain.He has been on 1 g/kg every 3 weeks. Since being back on the IVIG things seem stableHe is wondering if there are any other options. He has been reading about [facility]. He discussed it with the prior neurologist. Treatment history/failed: - IVIG started in November 2023.  He was receiving IVIG 35 g every day x 4 days every 4 weeks (which is approximately 2 g/kg every 4 weeks).  IVIG discontinued May 2024 given no robust improvement with IVIG supplementation.  Of note, had brief period of skin itchiness that resolved with topical steroids, cetirizine, and duloxetine. IVIG restarted Fall of 2024 due to significant worsening while off. On 1 g/kg every 3 weeks.Allergies as of 03/01/2024  (No Known Allergies (drug, envir, food or latex))  Medications: Current Outpatient Medications Medication Sig Dispense Refill  aspirin 81 mg EC tablet Take 1 tablet (81 mg total) by mouth daily.    Meds Exist in Supportive Plan immune globulin  (Privigen )    latanoprostene bunod  (VYZULTA) 0.024 % ophthalmic solution 1 drop into affected eye in the evening    levothyroxine (SYNTHROID, LEVOTHROID) 75 mcg tablet 1 tablet in the morning on an empty stomach    Multiple Vitamins-Minerals (PRESERVATION AREDS) TABS tablet as directed   No current facility-administered medications for this visit.  Physical Examination: There were no vitals taken for this visit.General: Pleasant and sitting comfortablyCV:  warm and well perfusedPulm: Normal work of breathingExtremities: high arches and hammertoesMS: Alert and interactive. Language appears intact.CN: Ocular versions full and conjugate. Pursuits are smooth. Facial sensation intact to light touch.  Able to raise eye brows symmetrically. Able to bury lashes on eye closure with symmetrical resistance to opening. No ptosis. Able to puff up cheeks. Smile symmetrical with equal activation. Hearing grossly intact. Palate elevates symmetrically. Shoulder shrug strong bilaterally. Tongue strong with lateral protrusion.  No fasciculations in tongue. No atrophy of the tongue. No dysarthria.Motor:Tone: Increase in tone in bilateral upper extremities, right more than left. Worsens with distracting maneuvers.Muscle bulk: Atrophy in bilateral hands.Involuntary movements: Postural tremor with arms extended, no rest tremor. Fasciculations observed in bilateral thighs.Muscle Right Left Neck flexor -  Upper extremity   Shoulder abductor 5 5 Shoulder forward flexor 5 5 Elbow flexors 5 5 Elbow extensors 5 5 Wrist flexors 5 5 Wrist extensors 5 5 Distal finger flexors  5 5 Finger extensors +4 +4 Finger abductors 4 4 Thumb flexor (FPL) 4 4 Thumb abductor 4 4 Lower extremity   Hip flexor 5 5 Hip abductor - -  Knee flexor 5 5 Knee extensor 5 5 Ankle dorsiflexor 4 4 Ankle plantarflexor 4 4 Ankle inversion 5 5 Ankle eversion 5 5 Toe extensor 3 3 Toe flexor - - He has difficulty  walking on toes and heels. Uses his arms to support to stand up.  Reflex Right Left BR 2+ 2+ Biceps 2+ 2+ Triceps 2+ 2+ Patellae 0 0 Ankles 0 0 Sensory:Pin Prick: mid foot and fingertips bilaterallyLight touch: Intact and symmetric in upper and lower extremities.Vibration (R/L, in seconds) Great toe: 0/0 Medial malleolus: 7/7Position Sense: Absent at the great toesRomberg's sign: NegativeCoordination:Finger-Nose-Finger: IntactGait: Wide based gait. Unable to tandem gait.Functional Testing: 5 STS:03/12/2023: 12.70 seconds30 Foot Go: 03/12/2023: 8.09Grip Strength (using electric dynamometer in Kg) 03/12/2023: R-25.4, 24.1, 28.1= avg 25kg L- 21.1, 22.9, 21.9= avg 21kg6/4/25: R- 25.3, 26.6, 24.1= avg 25.3 kg  L-19.7, 23.2, 20.7= avg 21.2 kgRODS for GBS - CIDP - MGUSP  RODS: 03/12/2023: 43/606/10/2023: 40Pertinent Results Labs Reviewed:Negative AChR abs 2/8/23CK 398CSF (04/15/22, per patient message): Prot 53CSF (10/28/22): WBC 4, RBC 3, gluc 62, prot 54, culture negInvitae Comprehensive Neuropathies Panel (03/18/2022): NegativeLab Results Component Value Date  VB12 444 03/04/2022  MMAS 0.16 03/04/2022  FOL >14.0 03/04/2022  VITB1 159 03/04/2022  VITB6 43.5 03/04/2022  HA1C 5.7 (H) 03/04/2022  CUS 94.4 03/04/2022  INTPE Normal Pattern 03/04/2022  SIF1 Normal Pattern 03/04/2022  ANAS NEG 03/04/2022  TSH 3.26 03/04/2022  FT4 1.3 03/04/2022  ZINP 64.8 03/04/2022  ESR 12 03/04/2022  CRP <3 03/04/2022 Imaging Reviewed: MRI L spine w/o contrast 08/08/21 by report: mild disc bulge L4-5MRI T spine w/o contrast 08/10/21 by report: cord normal; thoracic degenerative changesMRI L-spine w/wout (03/16/22): Multilevel disc degenerative changes with individual levels as described above.No abnormal enhancement within the cauda equina.SOFT TISSUES: Marked atrophy of multiple visualized  right-sided muscles including the psoas (more pronounced in the pelvis), iliacus muscle as well as the piriformis muscle. Mild near symmetrical atrophic changes in the gluteal musclesMRI Brain without contrast 09/30/22: Central volume loss versus underlying normal pressure hydrocephalus.MRI C-spine w/out contrast 09/30/22: No evidence of intramedullary signal abnormality. No significant cervical canal stenosis. There is right greater than left foraminal stenosis at C4-5 and C5-6, not significantly different from Jan 2023 MRI. Modic type 1 change at C4-5 and C5-6, present previously. Straightened upper cervical lordosis is unchanged. There is mild cervical dextrocurvature scoliosis. Procedures Reviewed:EMG/NCS (08/01/21, [facility] [place])Normal per EMG impression. Review of data shows absent sensory nerves in the legs and reduced amplitudes and slowing of conduction velocities in the motor nerves of the legs.EMG/NCS 03/04/22: This is an abnormal study.  The right median and ulnar motor responses are normal. The right tibial motor response has a low amplitude with a prolonged latency and mild conduction velocity slowing. The right fibular motor response recording at the EDB has a low amplitude and a normal conduction velocity. The fibular motor response recording at the TA has a borderline low amplitude with normal conduction velocity.   The right median F-wave responses are mildly prolonged.  The right median, ulnar, and radial sensory responses have low amplitudes. The right sural sensory response is normal for age.   Neuromuscular ultrasound of the median nerve throughout the right arm shows a normal cross sectional area. The right brachial plexus also shows a normal cross sectional area.  Needle EMG examination of the right upper and lower extremity shows length-dependent reinnervation with uncompensated denervation in distal leg muscles.  All limb muscles tested are  affected including deltoid in the  arm and iliopsoas in the leg.  The upper trapezius and mid-thoracic paraspinals are spared.    Overall, there is electrodiagnostic evidence of a moderate, predominantly axonal, sensorimotor polyneuropathy (versus a poly-radiculo-neuropathy) with length-dependent motor axon loss, and non-length-dependent sensory axon loss. Impression: 1) Sensorimotor polyneuropathyGreg Lawrence is a 72 y.o.  with a sensorimotor polyneuropathy with slow progression since 2021. Overall, cause is unclear despite extensive diagnostic evaluation. There was consideration of CIDP given temporal relationship to vaccination and mild CSF protein elevation though he has received. Initially, he did not seem to obtain benefit from IVIG but he worsened noticebly after it was stopped. He has had relative stability in his symptoms compared to the decline he experienced while off IVIG, though there has been some mild decline over the past year. At this point, we could consider increasing the IVIG to 1 g/kg every 2 weeks to see if he can obtain further benefits. He would prefer to maintain current regimen until he gets back to [place] , when it will be easier to coordinate travel to the infusion center. We will recommend that he follows up with us  before he goes to [place]  to evaluate for signs of further decline. We may consider repeating EMG/NCS at that point to see if there have been any changes to point to a potential etiology of his neuropathy. Additional testing we could consider aside from this is a nerve biopsy.Recommendations: Can consider increasing IVIG to 1 g/kg every 2 weeks, will consider switching to this regimen [place]ouraged to record functional testing monthly while on IVIG to re-evaluate and assess for improvementAfter completion of note, will send records to Dr. Dasie Dills neurology office in [place] .Advised to follow-up in [place]  [place] going [place] you for allowing us  to participate in the care of your patient.  Please call us  with any questions.Powell Raman, NP, Neurology PGY408/26/25

## 2024-02-29 NOTE — Telephone Encounter (Signed)
 Called pt to inquire about his IVIG infusions. Pt stated he will be getting his IVIG infusions in [place] after his 9/15 infusion and he will return some time in May of 2026

## 2024-03-01 ENCOUNTER — Ambulatory Visit

## 2024-03-01 ENCOUNTER — Ambulatory Visit: Attending: Oncology | Admitting: Family

## 2024-03-01 VITALS — BP 130/60 | HR 57 | Temp 97.3°F | Wt 210.0 lb

## 2024-03-01 VITALS — BP 132/73 | HR 51 | Temp 97.0°F | Resp 18

## 2024-03-01 DIAGNOSIS — G6189 Other inflammatory polyneuropathies: Secondary | ICD-10-CM | POA: Insufficient documentation

## 2024-03-01 DIAGNOSIS — G629 Polyneuropathy, unspecified: Secondary | ICD-10-CM

## 2024-03-01 DIAGNOSIS — G6181 Chronic inflammatory demyelinating polyneuritis: Secondary | ICD-10-CM

## 2024-03-01 LAB — RENAL FUNCTION PANEL
Albumin: 4.1 g/dL (ref 3.5–5.2)
Anion Gap: 15 (ref 7–16)
CO2: 24 mmol/L (ref 20–28)
Calcium: 9.2 mg/dL (ref 8.6–10.2)
Chloride: 100 mmol/L (ref 96–108)
Creatinine: 1.12 mg/dL (ref 0.67–1.17)
Glucose: 102 mg/dL — ABNORMAL HIGH (ref 60–99)
Lab: 18 mg/dL (ref 6–20)
Phosphorus: 3.8 mg/dL (ref 2.7–4.5)
Potassium: 4.7 mmol/L (ref 3.3–5.1)
Sodium: 139 mmol/L (ref 133–145)
eGFR BY CREAT: 69

## 2024-03-01 MED ORDER — IMMUNE GLOBULIN (HUMAN) 10% 5 GM/50ML IV SOLN (PRIVIGEN) *I*
1.0000 g/kg | Freq: Once | INTRAVENOUS | Status: AC
Start: 2024-03-01 — End: 2024-03-01
  Administered 2024-03-01: 95 g via INTRAVENOUS
  Filled 2024-03-01: qty 100

## 2024-03-01 NOTE — Progress Notes (Signed)
 Patient presents for an infusion of IVIG 95 gms in usual state of health. Premedicated at home.  Infusion started @ 1029 and stopped @ 1450 . Max rate 300 ml/hr. Pt tolerated infusion well, no adverse reactions reported or observed. Pt was discharged to home in stable condition. AVS and discharge instructions reviewed.

## 2024-03-01 NOTE — Patient Instructions (Signed)
 Please reach out to the clinic with any additional questions or concerns. We encourage you to sign up and use MyChart for any non-urgent medical questions as MyChart is the preferred method of communication.  Please contact your pharmacy for medication refills and allow up to 2 business days for routine prescription refills.  For urgent messages, please call the clinic at [phone].  The clinic is open from 8am to 4:30pm Monday through Friday.    You can get [facility] appointment reminders by text - please make sure we have your cell phone number on file at check in/check-out.  Then you can just text [facility] to [phone] to receive appointment messages.        You received IVIG (IntraVenous Immune Globulin) today. Many people  do not have side effects from IVIG but some do. Some side effects are  noticed right a way and others can develop over time.  The most common side effects are:  Headache  Stiff neck  Fever  Chills  Flushing  Flu like muscle pains  Joint pain  Feeling tired  Upset stomach  Vomiting  Diarrhea  For the most part ,these reactions are mild and typically happen with  the first few doses. If you experience any of these symptoms let your  nurse know when you come for your next infusion. These symptoms can  be managed by either slowing down how fast the IVIG is given or giving  medications such as Tylenol,Benadryl or a small amount of steroid  before the IVIG is started.  If you experience any of the above side effects while at home you can  take tylenol as prescribed. Drinking extra water can also help. If  symptoms are not relieved after 48 hours or headache is severe please  call the clinic at [phone].

## 2024-03-01 NOTE — Patient Instructions (Signed)
 Increase frequency of IVIG to every 2 weeksWill send notes to Dr. Janus will see you back when you are back from [place] .

## 2024-03-08 ENCOUNTER — Other Ambulatory Visit: Payer: Self-pay | Admitting: Family

## 2024-03-08 NOTE — Progress Notes (Signed)
 IVIG orders signed. Will send a message to scheduler since he needs his ivig every 2 weeks now.

## 2024-03-09 ENCOUNTER — Telehealth: Payer: Self-pay

## 2024-03-09 NOTE — Telephone Encounter (Addendum)
 Patient Telephone Call - Callback DocumentationCaller name: Dr. Dasie Lifton's officeRelationship to patient: Neurologist Callback number: [phone] Information: Called explaining that the patient called them and stated that our office was waiting on orders for his new IVIG schedule to be signed by them to continue and they wanted to see if that was accurate, because they have not yet received any paperwork.Fax # [phone]

## 2024-03-15 ENCOUNTER — Other Ambulatory Visit: Payer: Self-pay

## 2024-03-16 ENCOUNTER — Ambulatory Visit: Attending: Family

## 2024-03-16 VITALS — BP 138/75 | HR 59 | Temp 97.2°F | Resp 16 | Wt 207.0 lb

## 2024-03-16 DIAGNOSIS — G6189 Other inflammatory polyneuropathies: Secondary | ICD-10-CM | POA: Insufficient documentation

## 2024-03-16 MED ORDER — IMMUNE GLOBULIN (HUMAN) 10% 5 GM/50ML IV SOLN (PRIVIGEN) *I*
1.0000 g/kg | Freq: Once | INTRAVENOUS | Status: AC
Start: 2024-03-16 — End: 2024-03-16
  Administered 2024-03-16: 95 g via INTRAVENOUS
  Filled 2024-03-16: qty 950

## 2024-03-16 NOTE — Progress Notes (Signed)
 Patient presents for an infusion of IVIG-Privigen  95 gms. Takes his own premeds at home Infusion started @0937  and stopped @  1408   . Pt tolerated infusion well, no adverse reactions reported or observed.max rate 300cc/hr

## 2024-03-16 NOTE — Patient Instructions (Signed)
You received IVIG (IntraVenous Immune Globulin) today. Many people  do not have side effects from IVIG but some do. Some side effects are  noticed right a way and others can develop over time.  The most common side effects are:  Headache  Stiff neck  Fever  Chills  Flushing  Flu like muscle pains  Joint pain  Feeling tired  Upset stomach  Vomiting  Diarrhea  For the most part ,these reactions are mild and typically happen with  the first few doses. If you experience any of these symptoms let your  nurse know when you come for your next infusion. These symptoms can  be managed by either slowing down how fast the IVIG is given or giving  medications such as Tylenol,Benadryl or a small amount of steroid  before the IVIG is started.  If you experience any of the above side effects while at home you can  take tylenol as prescribed. Drinking extra water can also help. If  symptoms are not relieved after 48 hours or headache is severe please  call the clinic at [phone].      Please reach out to the clinic with any additional questions or concerns. We encourage you to sign up and use MyChart for any non-urgent medical questions as MyChart is the preferred method of communication.  Please contact your pharmacy for medication refills and allow up to 2 business days for routine prescription refills.  For urgent messages, please call the clinic at [phone].  The clinic is open from 8am to 4:30pm Monday through Friday.    You can get [facility] appointment reminders by text - please make sure we have your cell phone number on file at check in/check-out.  Then you can just text [facility] to [phone] to receive appointment messages.

## 2024-03-20 ENCOUNTER — Ambulatory Visit

## 2024-03-23 ENCOUNTER — Ambulatory Visit

## 2024-03-29 ENCOUNTER — Ambulatory Visit

## 2024-04-13 ENCOUNTER — Ambulatory Visit

## 2024-05-04 ENCOUNTER — Ambulatory Visit

## 2024-05-25 ENCOUNTER — Ambulatory Visit

## 2024-08-04 ENCOUNTER — Telehealth: Payer: Self-pay

## 2024-08-04 NOTE — Telephone Encounter (Signed)
 Returned call to Dr. Gillie, We discussed his clinical worsening, elevated sed rate.  Steroid 1 g every 2 weeks with the IVIG.  Check pre-rituximab labs as that would likely be the next step in terms of treatment.  TB, VZV titers, Hep B and C, and CBC w/diff and lymphocyte subset.  Also recommend he receive vaccinations for pneumonia, flu, and shingles if not already done.  He will update me after a few steroid treatments to discuss additional steps. Manus JAYSON Garland, MD

## 2024-08-04 NOTE — Telephone Encounter (Signed)
 Patient Telephone Call - Provider to ProviderName of provider calling: Dr Dasie Edelman is the provider from?: [place]rology in [place]Reason for the call: [phone] of call: Explains patient has gotten a lot weaker and has an elevated sed rate despite increase in IVIG dose. He would like to get your advice on on next steps for the patient.

## 2024-12-13 ENCOUNTER — Ambulatory Visit: Admitting: Family
